# Patient Record
Sex: Female | Born: 1945 | Race: Black or African American | Hispanic: No | Smoking: Never smoker
Health system: Southern US, Community
[De-identification: ages and names within clinical notes are randomized; demographics above are authoritative.]

## PROBLEM LIST (undated history)

## (undated) DIAGNOSIS — M1990 Unspecified osteoarthritis, unspecified site: Secondary | ICD-10-CM

## (undated) DIAGNOSIS — I82A29 Chronic embolism and thrombosis of unspecified axillary vein: Secondary | ICD-10-CM

## (undated) DIAGNOSIS — H269 Unspecified cataract: Secondary | ICD-10-CM

## (undated) DIAGNOSIS — E669 Obesity, unspecified: Secondary | ICD-10-CM

## (undated) DIAGNOSIS — K5790 Diverticulosis of intestine, part unspecified, without perforation or abscess without bleeding: Secondary | ICD-10-CM

## (undated) DIAGNOSIS — Z86718 Personal history of other venous thrombosis and embolism: Secondary | ICD-10-CM

## (undated) DIAGNOSIS — R7303 Prediabetes: Secondary | ICD-10-CM

## (undated) DIAGNOSIS — J189 Pneumonia, unspecified organism: Secondary | ICD-10-CM

## (undated) HISTORY — DX: Personal history of other venous thrombosis and embolism: Z86.718

## (undated) HISTORY — DX: Prediabetes: R73.03

## (undated) HISTORY — DX: Unspecified osteoarthritis, unspecified site: M19.90

## (undated) HISTORY — DX: Obesity, unspecified: E66.9

## (undated) HISTORY — PX: OOPHORECTOMY: SHX86

## (undated) HISTORY — PX: SUPRANUMERARY NIPPLE EXCISION: SHX2471

## (undated) HISTORY — PX: BREAST EXCISIONAL BIOPSY: SUR124

## (undated) HISTORY — PX: OTHER SURGICAL HISTORY: SHX169

## (undated) HISTORY — DX: Diverticulosis of intestine, part unspecified, without perforation or abscess without bleeding: K57.90

## (undated) HISTORY — PX: CATARACT EXTRACTION: SUR2

---

## 1999-11-08 DIAGNOSIS — I82A29 Chronic embolism and thrombosis of unspecified axillary vein: Secondary | ICD-10-CM

## 1999-11-08 HISTORY — DX: Chronic embolism and thrombosis of unspecified axillary vein: I82.A29

## 2004-08-20 ENCOUNTER — Encounter: Payer: Self-pay | Admitting: Internal Medicine

## 2004-09-01 ENCOUNTER — Encounter: Admission: RE | Admit: 2004-09-01 | Discharge: 2004-09-01 | Payer: Self-pay | Admitting: Internal Medicine

## 2004-11-07 HISTORY — PX: ABDOMINAL HYSTERECTOMY: SHX81

## 2005-07-18 ENCOUNTER — Ambulatory Visit: Payer: Self-pay | Admitting: Internal Medicine

## 2005-07-19 ENCOUNTER — Encounter: Admission: RE | Admit: 2005-07-19 | Discharge: 2005-07-19 | Payer: Self-pay | Admitting: General Surgery

## 2005-11-17 ENCOUNTER — Emergency Department (HOSPITAL_COMMUNITY): Admission: EM | Admit: 2005-11-17 | Discharge: 2005-11-18 | Payer: Self-pay | Admitting: Emergency Medicine

## 2005-11-17 ENCOUNTER — Ambulatory Visit: Payer: Self-pay | Admitting: Internal Medicine

## 2005-11-17 ENCOUNTER — Ambulatory Visit: Payer: Self-pay | Admitting: Cardiology

## 2005-11-23 ENCOUNTER — Ambulatory Visit: Payer: Self-pay | Admitting: Internal Medicine

## 2005-12-22 ENCOUNTER — Other Ambulatory Visit: Admission: RE | Admit: 2005-12-22 | Discharge: 2005-12-22 | Payer: Self-pay | Admitting: Obstetrics and Gynecology

## 2006-01-17 ENCOUNTER — Ambulatory Visit: Payer: Self-pay | Admitting: Internal Medicine

## 2006-01-23 ENCOUNTER — Ambulatory Visit: Payer: Self-pay | Admitting: Internal Medicine

## 2006-02-03 ENCOUNTER — Encounter: Payer: Self-pay | Admitting: Internal Medicine

## 2006-02-03 ENCOUNTER — Ambulatory Visit: Payer: Self-pay

## 2006-02-14 ENCOUNTER — Encounter (INDEPENDENT_AMBULATORY_CARE_PROVIDER_SITE_OTHER): Payer: Self-pay | Admitting: *Deleted

## 2006-02-14 ENCOUNTER — Inpatient Hospital Stay (HOSPITAL_COMMUNITY): Admission: RE | Admit: 2006-02-14 | Discharge: 2006-02-17 | Payer: Self-pay | Admitting: Obstetrics and Gynecology

## 2006-02-22 ENCOUNTER — Inpatient Hospital Stay (HOSPITAL_COMMUNITY): Admission: AD | Admit: 2006-02-22 | Discharge: 2006-02-24 | Payer: Self-pay | Admitting: Obstetrics and Gynecology

## 2006-08-08 ENCOUNTER — Encounter: Admission: RE | Admit: 2006-08-08 | Discharge: 2006-08-08 | Payer: Self-pay | Admitting: Internal Medicine

## 2006-10-02 ENCOUNTER — Emergency Department (HOSPITAL_COMMUNITY): Admission: EM | Admit: 2006-10-02 | Discharge: 2006-10-03 | Payer: Self-pay | Admitting: Emergency Medicine

## 2006-10-07 ENCOUNTER — Ambulatory Visit: Payer: Self-pay | Admitting: Family Medicine

## 2006-11-07 HISTORY — PX: OTHER SURGICAL HISTORY: SHX169

## 2007-05-28 DIAGNOSIS — J45909 Unspecified asthma, uncomplicated: Secondary | ICD-10-CM | POA: Insufficient documentation

## 2007-06-18 ENCOUNTER — Ambulatory Visit: Payer: Self-pay | Admitting: Internal Medicine

## 2007-06-25 ENCOUNTER — Ambulatory Visit: Payer: Self-pay | Admitting: Internal Medicine

## 2007-06-25 LAB — CONVERTED CEMR LAB
ALT: 23 units/L (ref 0–35)
AST: 21 units/L (ref 0–37)
Albumin: 4.1 g/dL (ref 3.5–5.2)
Alkaline Phosphatase: 77 units/L (ref 39–117)
BUN: 8 mg/dL (ref 6–23)
Basophils Absolute: 0 10*3/uL (ref 0.0–0.1)
Basophils Relative: 0.4 % (ref 0.0–1.0)
Bilirubin Urine: NEGATIVE
Bilirubin, Direct: 0.1 mg/dL (ref 0.0–0.3)
CO2: 32 meq/L (ref 19–32)
Calcium: 10.5 mg/dL (ref 8.4–10.5)
Chloride: 113 meq/L — ABNORMAL HIGH (ref 96–112)
Cholesterol: 186 mg/dL (ref 0–200)
Creatinine, Ser: 0.7 mg/dL (ref 0.4–1.2)
Creatinine,U: 142.2 mg/dL
Eosinophils Absolute: 0.2 10*3/uL (ref 0.0–0.6)
Eosinophils Relative: 3.9 % (ref 0.0–5.0)
GFR calc Af Amer: 111 mL/min
GFR calc non Af Amer: 91 mL/min
Glucose, Bld: 114 mg/dL — ABNORMAL HIGH (ref 70–99)
Glucose, Urine, Semiquant: NEGATIVE
HCT: 37 % (ref 36.0–46.0)
HDL: 44 mg/dL (ref >39.0)
Hemoglobin: 12.4 g/dL (ref 12.0–15.0)
Hgb A1c MFr Bld: 5.4 % (ref 4.6–6.0)
Ketones, urine, test strip: NEGATIVE
LDL Cholesterol: 129 mg/dL — ABNORMAL HIGH (ref 0–99)
Lymphocytes Relative: 28.6 % (ref 12.0–46.0)
MCHC: 33.6 g/dL (ref 30.0–36.0)
MCV: 86 fL (ref 78.0–100.0)
Microalb Creat Ratio: 14.1 mg/g (ref 0.0–30.0)
Microalb, Ur: 2 mg/dL — ABNORMAL HIGH (ref 0.0–1.9)
Monocytes Absolute: 0.4 10*3/uL (ref 0.2–0.7)
Monocytes Relative: 6.5 % (ref 3.0–11.0)
Neutro Abs: 3.8 10*3/uL (ref 1.4–7.7)
Neutrophils Relative %: 60.6 % (ref 43.0–77.0)
Nitrite: NEGATIVE
Platelets: 268 10*3/uL (ref 150–400)
Potassium: 4.9 meq/L (ref 3.5–5.1)
Protein, U semiquant: NEGATIVE
RBC: 4.3 M/uL (ref 3.87–5.11)
RDW: 14.6 % (ref 11.5–14.6)
Sodium: 149 meq/L — ABNORMAL HIGH (ref 135–145)
Specific Gravity, Urine: 1.015
TSH: 1.56 microintl units/mL (ref 0.35–5.50)
Total Bilirubin: 0.6 mg/dL (ref 0.3–1.2)
Total CHOL/HDL Ratio: 4.2
Total Protein: 8.3 g/dL (ref 6.0–8.3)
Triglycerides: 67 mg/dL (ref 0–149)
Urobilinogen, UA: 0.2
VLDL: 13 mg/dL (ref 0–40)
WBC Urine, dipstick: NEGATIVE
WBC: 6.2 10*3/uL (ref 4.5–10.5)
pH: 5

## 2007-07-30 ENCOUNTER — Ambulatory Visit: Payer: Self-pay | Admitting: Internal Medicine

## 2007-09-10 ENCOUNTER — Ambulatory Visit: Payer: Self-pay | Admitting: Gastroenterology

## 2007-09-13 ENCOUNTER — Ambulatory Visit: Payer: Self-pay | Admitting: Internal Medicine

## 2007-09-24 ENCOUNTER — Ambulatory Visit: Payer: Self-pay | Admitting: Gastroenterology

## 2007-09-24 ENCOUNTER — Encounter: Payer: Self-pay | Admitting: Internal Medicine

## 2007-10-08 ENCOUNTER — Ambulatory Visit: Payer: Self-pay | Admitting: Internal Medicine

## 2007-10-08 DIAGNOSIS — L919 Hypertrophic disorder of the skin, unspecified: Secondary | ICD-10-CM

## 2007-10-08 DIAGNOSIS — L909 Atrophic disorder of skin, unspecified: Secondary | ICD-10-CM | POA: Insufficient documentation

## 2007-10-11 ENCOUNTER — Ambulatory Visit (HOSPITAL_COMMUNITY): Admission: RE | Admit: 2007-10-11 | Discharge: 2007-10-11 | Payer: Self-pay | Admitting: Internal Medicine

## 2007-10-11 ENCOUNTER — Ambulatory Visit: Payer: Self-pay | Admitting: Internal Medicine

## 2007-10-22 ENCOUNTER — Ambulatory Visit: Payer: Self-pay | Admitting: Internal Medicine

## 2008-01-09 ENCOUNTER — Telehealth: Payer: Self-pay | Admitting: Internal Medicine

## 2008-09-27 IMAGING — CT CT ABDOMEN W/O CM
2 of 4 series · 17 of 46 positions shown, 19 images · IV contrast (agent unspecified)
Comparison: 02/23/06.

CLINICAL DATA: Abdominal and pelvic pain.  Flank pain.
ABDOMEN CT WITHOUT CONTRAST:
TECHNIQUE: Multidetector CT imaging of the abdomen was performed following the standard protocol without IV contrast.
TECHNIQUE: Multidetector CT imaging of the pelvis was performed following the standard protocol without IV contrast.

[Series 2: stone_wo 5.0 b40s st · axial · 0.79mm/px · z∈[-443,-131]mm · 14 of 89 slices shown, 16 images]
[im 7/89  soft-tissue]
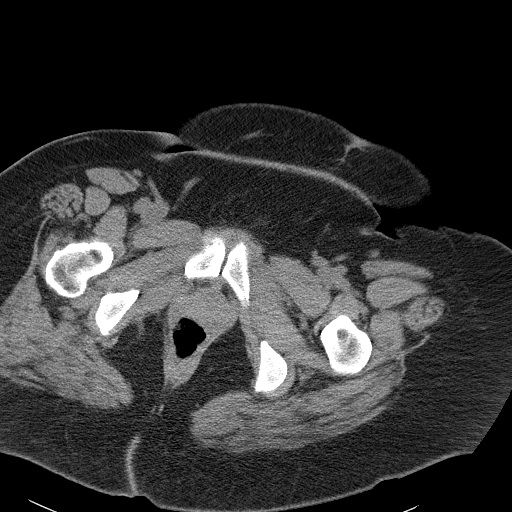
[im 7/89  bone]
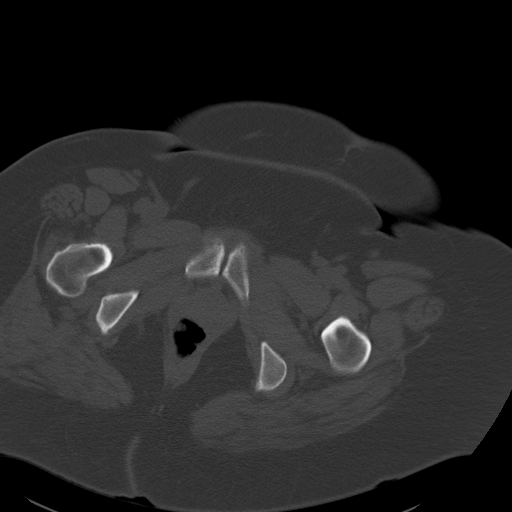
[im 13/89  soft-tissue]
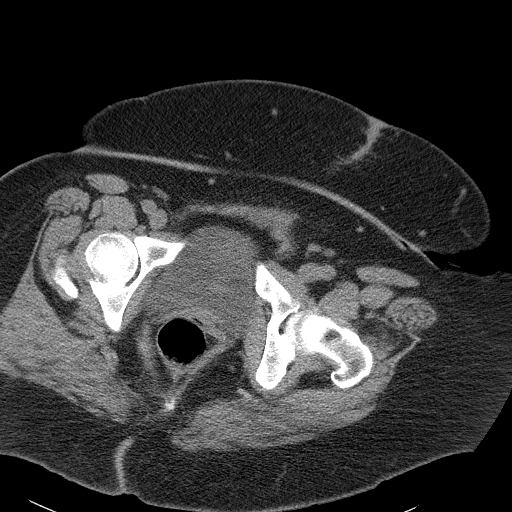
[im 19/89  soft-tissue]
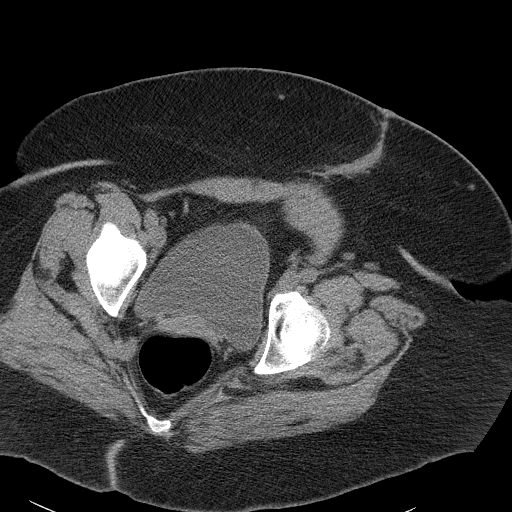
[im 25/89  soft-tissue]
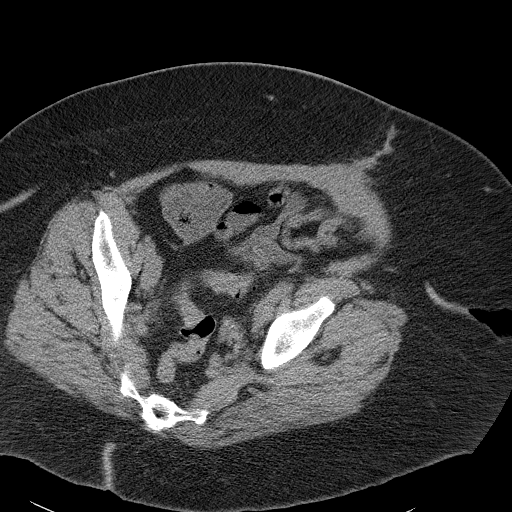
[im 31/89  soft-tissue]
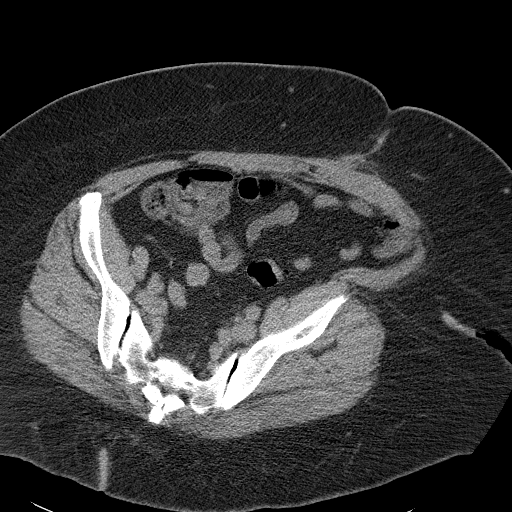
[im 37/89  soft-tissue]
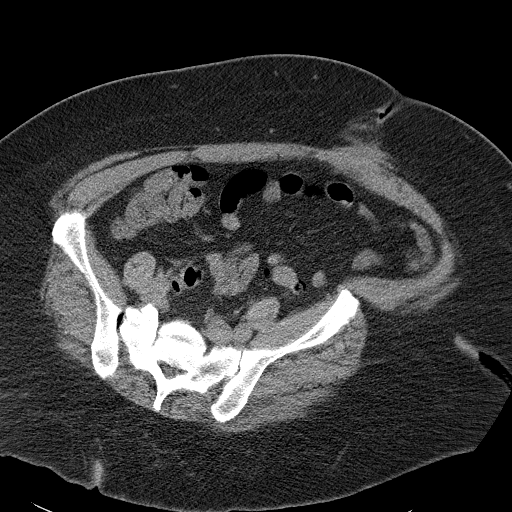
[im 43/89  soft-tissue]
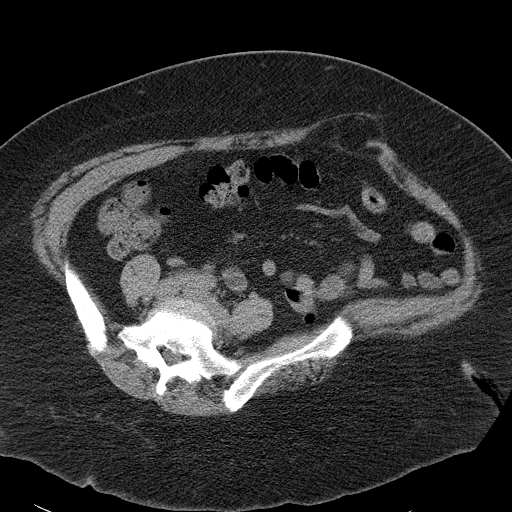
[im 49/89  soft-tissue]
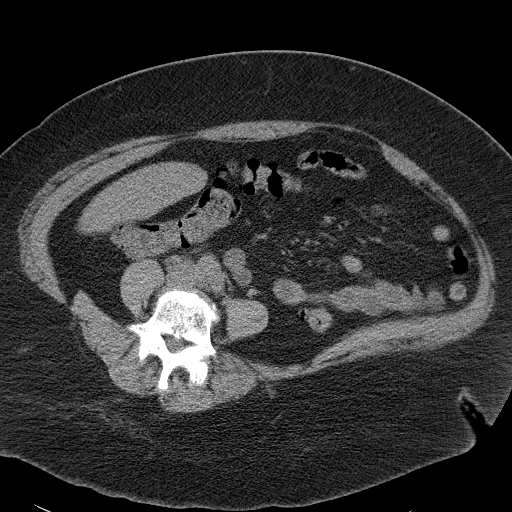
[im 55/89  soft-tissue]
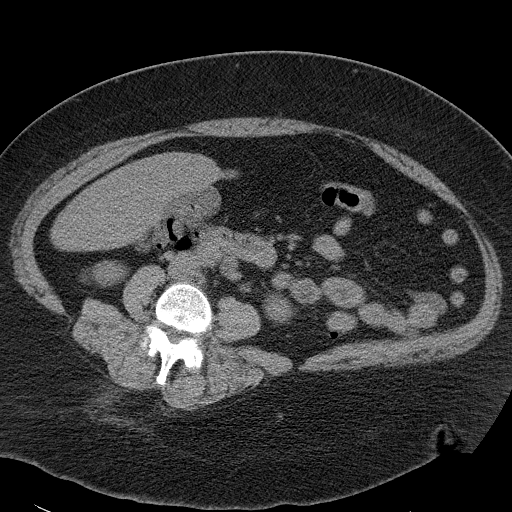
[im 55/89  bone]
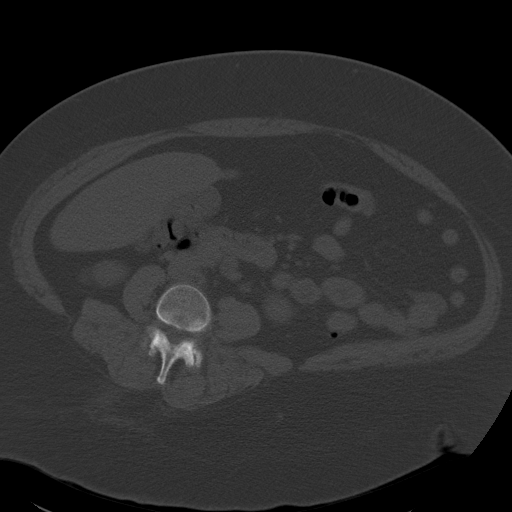
[im 61/89  soft-tissue]
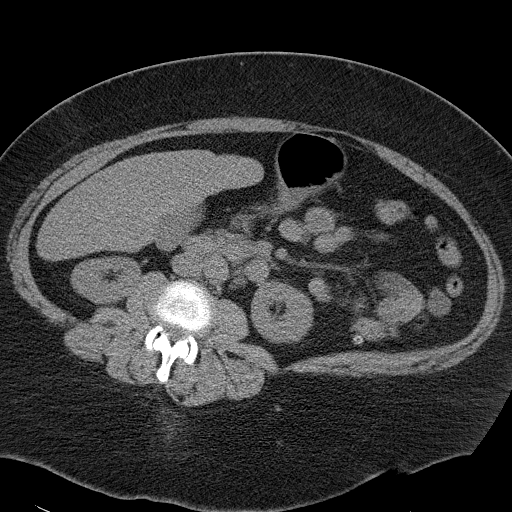
[im 67/89  soft-tissue]
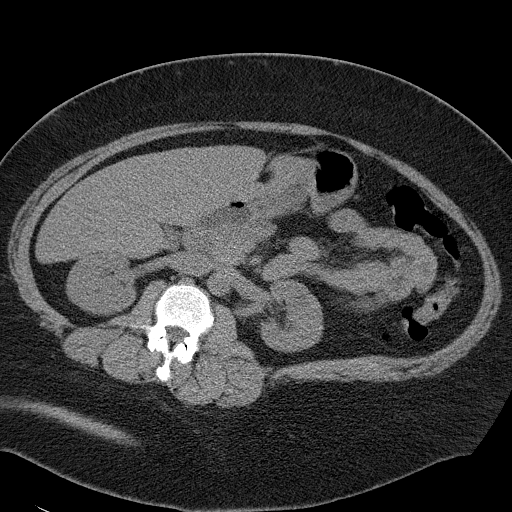
[im 73/89  soft-tissue]
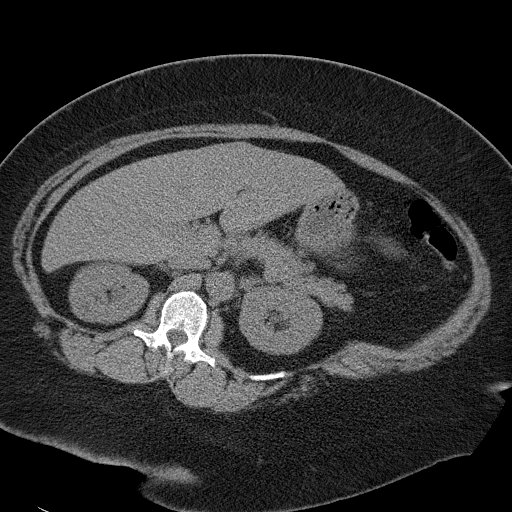
[im 79/89  soft-tissue]
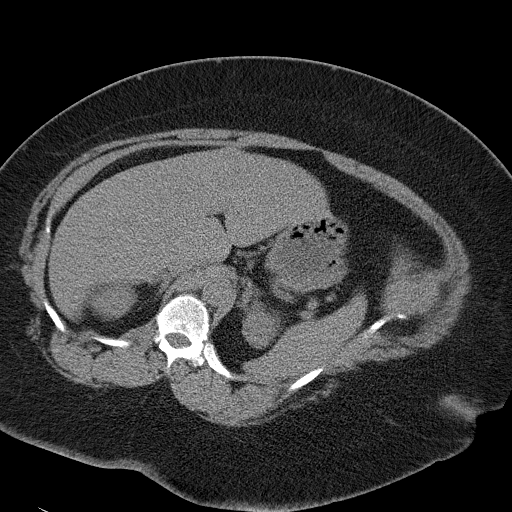
[im 85/89  soft-tissue]
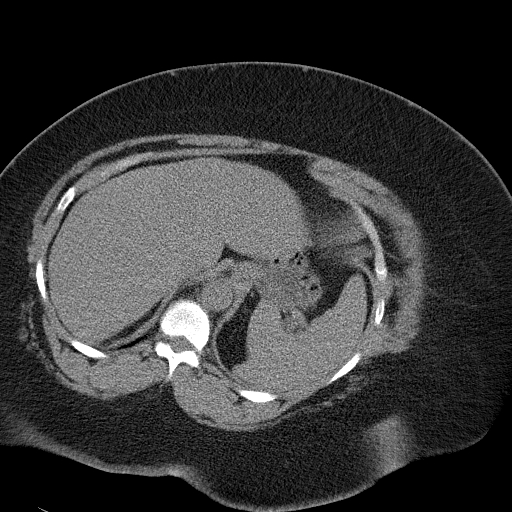

[Series 602: <mpr range> · coronal · 0.79mm/px · 3 of 84 slices shown]
[im 28/84  soft-tissue]
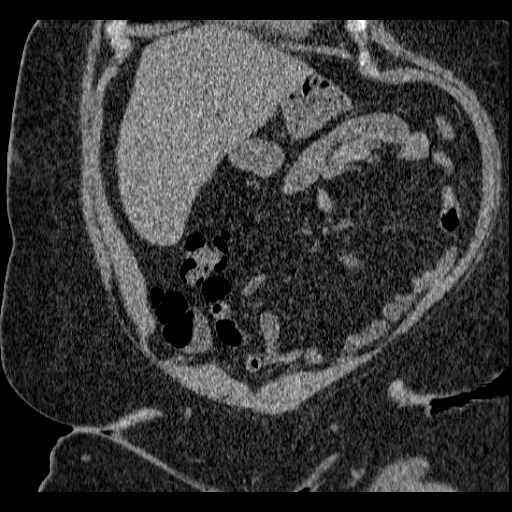
[im 37/84  soft-tissue]
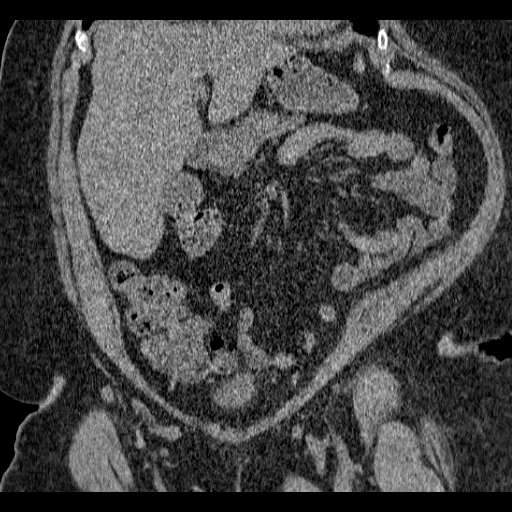
[im 47/84  soft-tissue]
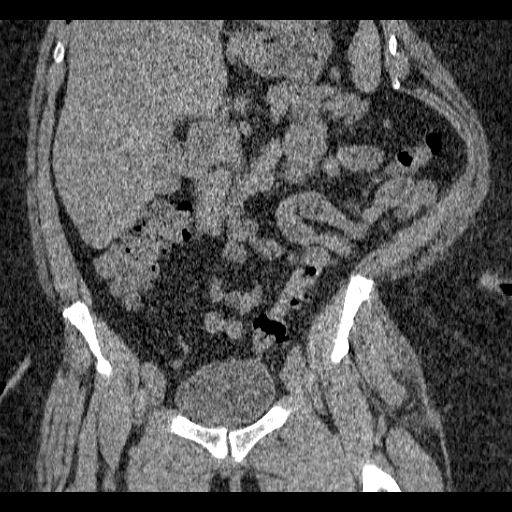

[17 of 46 positions shown; findings below may reference images not displayed]

FINDINGS: The visualized portions of the liver and spleen are within normal limits.  The pancreas, adrenal glands, kidneys, and gallbladder are within normal limits.  The renal lesions seen on the prior study are not well-visualized.  Please note that parenchymal abnormalities may be missed as intravenous contrast was not administered.  No evidence of free fluid, enlarged lymph nodes, abdominal aortic aneurysm, or biliary dilatation.  No urinary calculi are identified.  
An umbilical hernia containing fat is identified.  Colonic diverticulosis is present without evidence of diverticulitis.
IMPRESSION: 1.  No acute abnormality.
2.  Colonic diverticulosis without evidence of diverticulitis.
3.  Stable umbilical hernia containing fat.  
PELVIS CT WITHOUT CONTRAST:
FINDINGS: Colonic diverticulosis is present without evidence of diverticulitis.  There is no evidence of urinary calculi present.  No free fluid or enlarged lymph nodes are present.  The patient is status post hysterectomy.  Degenerative changes in the lumbar spine and SI joints noted.
IMPRESSION: No acute abnormality.

## 2008-10-16 ENCOUNTER — Ambulatory Visit (HOSPITAL_COMMUNITY): Admission: RE | Admit: 2008-10-16 | Discharge: 2008-10-16 | Payer: Self-pay | Admitting: Obstetrics and Gynecology

## 2008-11-05 ENCOUNTER — Encounter: Admission: RE | Admit: 2008-11-05 | Discharge: 2008-11-05 | Payer: Self-pay | Admitting: Obstetrics and Gynecology

## 2008-11-07 LAB — CONVERTED CEMR LAB: Pap Smear: NORMAL

## 2008-11-28 ENCOUNTER — Telehealth: Payer: Self-pay | Admitting: Internal Medicine

## 2008-11-28 ENCOUNTER — Encounter: Payer: Self-pay | Admitting: Internal Medicine

## 2008-12-01 ENCOUNTER — Ambulatory Visit: Payer: Self-pay | Admitting: Internal Medicine

## 2008-12-01 LAB — CONVERTED CEMR LAB
Albumin: 3.9 g/dL (ref 3.5–5.2)
Alkaline Phosphatase: 75 units/L (ref 39–117)
Basophils Absolute: 0 10*3/uL (ref 0.0–0.1)
Basophils Relative: 0.6 % (ref 0.0–3.0)
Bilirubin Urine: NEGATIVE
Bilirubin, Direct: 0.1 mg/dL (ref 0.0–0.3)
Blood in Urine, dipstick: NEGATIVE
Cholesterol: 197 mg/dL (ref 0–200)
Creatinine,U: 113.7 mg/dL
Eosinophils Relative: 3 % (ref 0.0–5.0)
GFR calc non Af Amer: 91 mL/min
HDL: 40 mg/dL (ref >39.0)
LDL Cholesterol: 136 mg/dL — ABNORMAL HIGH (ref 0–99)
Lymphocytes Relative: 38.1 % (ref 12.0–46.0)
Monocytes Absolute: 0.3 10*3/uL (ref 0.1–1.0)
Monocytes Relative: 6.1 % (ref 3.0–12.0)
Neutrophils Relative %: 52.2 % (ref 43.0–77.0)
Platelets: 228 10*3/uL (ref 150–400)
Sodium: 144 meq/L (ref 135–145)
TSH: 2.03 microintl units/mL (ref 0.35–5.50)
Total Bilirubin: 0.7 mg/dL (ref 0.3–1.2)
Total CHOL/HDL Ratio: 4.9
Total Protein: 8.4 g/dL — ABNORMAL HIGH (ref 6.0–8.3)
Triglycerides: 106 mg/dL (ref 0–149)
Urobilinogen, UA: 0.2
VLDL: 21 mg/dL (ref 0–40)

## 2008-12-09 ENCOUNTER — Ambulatory Visit: Payer: Self-pay | Admitting: Internal Medicine

## 2008-12-09 DIAGNOSIS — E669 Obesity, unspecified: Secondary | ICD-10-CM

## 2008-12-09 LAB — HM COLONOSCOPY

## 2008-12-19 ENCOUNTER — Encounter: Payer: Self-pay | Admitting: Internal Medicine

## 2008-12-24 ENCOUNTER — Ambulatory Visit (HOSPITAL_BASED_OUTPATIENT_CLINIC_OR_DEPARTMENT_OTHER): Admission: RE | Admit: 2008-12-24 | Discharge: 2008-12-24 | Payer: Self-pay | Admitting: Surgery

## 2008-12-24 ENCOUNTER — Encounter: Admission: RE | Admit: 2008-12-24 | Discharge: 2008-12-24 | Payer: Self-pay | Admitting: Surgery

## 2008-12-24 ENCOUNTER — Encounter (INDEPENDENT_AMBULATORY_CARE_PROVIDER_SITE_OTHER): Payer: Self-pay | Admitting: Surgery

## 2009-01-08 ENCOUNTER — Encounter: Payer: Self-pay | Admitting: Internal Medicine

## 2009-07-10 ENCOUNTER — Ambulatory Visit: Payer: Self-pay | Admitting: Internal Medicine

## 2009-07-10 DIAGNOSIS — L708 Other acne: Secondary | ICD-10-CM | POA: Insufficient documentation

## 2009-08-10 ENCOUNTER — Ambulatory Visit: Payer: Self-pay | Admitting: Internal Medicine

## 2010-01-04 ENCOUNTER — Ambulatory Visit: Payer: Self-pay | Admitting: Internal Medicine

## 2010-01-04 LAB — CONVERTED CEMR LAB
Bilirubin Urine: NEGATIVE
Blood in Urine, dipstick: NEGATIVE
Ketones, urine, test strip: NEGATIVE
Nitrite: NEGATIVE
Urobilinogen, UA: 0.2
WBC Urine, dipstick: NEGATIVE
pH: 5

## 2010-01-05 ENCOUNTER — Encounter: Payer: Self-pay | Admitting: Internal Medicine

## 2010-01-05 LAB — CONVERTED CEMR LAB
AST: 21 units/L (ref 0–37)
Albumin: 4.1 g/dL (ref 3.5–5.2)
Alkaline Phosphatase: 81 units/L (ref 39–117)
Chloride: 109 meq/L (ref 96–112)
Cholesterol: 210 mg/dL — ABNORMAL HIGH (ref 0–200)
Creatinine, Ser: 0.7 mg/dL (ref 0.4–1.2)
Direct LDL: 150.7 mg/dL
HDL: 50.7 mg/dL (ref >39.00)
Hemoglobin: 12.1 g/dL (ref 12.0–15.0)
Lymphocytes Relative: 37.8 % (ref 12.0–46.0)
Lymphs Abs: 2.1 10*3/uL (ref 0.7–4.0)
MCHC: 32.3 g/dL (ref 30.0–36.0)
MCV: 90.7 fL (ref 78.0–100.0)
Monocytes Relative: 5.8 % (ref 3.0–12.0)
Neutro Abs: 3 10*3/uL (ref 1.4–7.7)
Neutrophils Relative %: 52.4 % (ref 43.0–77.0)
Platelets: 232 10*3/uL (ref 150.0–400.0)
RBC: 4.13 M/uL (ref 3.87–5.11)
RDW: 14.1 % (ref 11.5–14.6)
TSH: 2.79 microintl units/mL (ref 0.35–5.50)
Total CHOL/HDL Ratio: 4
Total Protein: 8.5 g/dL — ABNORMAL HIGH (ref 6.0–8.3)
VLDL: 21.8 mg/dL (ref 0.0–40.0)

## 2010-01-11 ENCOUNTER — Ambulatory Visit: Payer: Self-pay | Admitting: Internal Medicine

## 2010-01-13 ENCOUNTER — Encounter: Payer: Self-pay | Admitting: Internal Medicine

## 2010-01-14 ENCOUNTER — Ambulatory Visit (HOSPITAL_COMMUNITY): Admission: RE | Admit: 2010-01-14 | Discharge: 2010-01-14 | Payer: Self-pay | Admitting: Internal Medicine

## 2010-01-14 LAB — HM MAMMOGRAPHY: HM Mammogram: NORMAL

## 2010-06-23 ENCOUNTER — Telehealth: Payer: Self-pay | Admitting: Internal Medicine

## 2010-11-26 ENCOUNTER — Other Ambulatory Visit: Payer: Self-pay | Admitting: Internal Medicine

## 2010-11-26 DIAGNOSIS — Z1231 Encounter for screening mammogram for malignant neoplasm of breast: Secondary | ICD-10-CM

## 2010-11-26 DIAGNOSIS — Z0000 Encounter for general adult medical examination without abnormal findings: Secondary | ICD-10-CM

## 2010-11-28 ENCOUNTER — Encounter: Payer: Self-pay | Admitting: Obstetrics and Gynecology

## 2010-11-28 ENCOUNTER — Encounter: Payer: Self-pay | Admitting: Internal Medicine

## 2010-12-07 NOTE — Medication Information (Signed)
Summary: Coverage Approval for Differin  Coverage Approval for Differin   Imported By: Maryln Gottron 01/15/2010 12:34:29  _____________________________________________________________________  External Attachment:    Type:   Image     Comment:   External Document

## 2010-12-07 NOTE — Progress Notes (Signed)
Summary: call  Phone Note Call from Patient Call back at Home Phone [phone]   Caller: vm Summary of Call: Right shoulder pain.  Could I use OTC Ibuprofen? Initial call taken by: Rudy Jew, RN,  June 23, 2010 1:48 PM  Follow-up for Phone Call        Doesn't have history of stomach upset or GI bleeding.  Will try them & perhaps also one of the medicinal rubs OTC, cautioned her to watch her skin & not burn it.   OV as needed.  Follow-up by: Rudy Jew, RN,  June 23, 2010 2:51 PM

## 2010-12-07 NOTE — Assessment & Plan Note (Signed)
Summary: CPX // RS   Vital Signs:  Patient profile:   65 year old female Menstrual status:  hysterectomy Height:      60 inches Weight:      319 pounds BMI:     62.53 Pulse rate:   80 / minute Pulse rhythm:   regular Resp:     12 per minute BP sitting:   118 / 76  (left arm) Cuff size:   large  Vitals Entered By: Gladis Riffle, RN (January 11, 2010 10:07 AM)  Nutrition Counseling: Patient's BMI is greater than 25 and therefore counseled on weight management options. CC: cpx, labs done Is Patient Diabetic? No Comments has not been taking doxycycline     Menstrual Status hysterectomy Last PAP Result normal-pt's report   CC:  cpx and labs done.  History of Present Illness: cpx  several additional complaints: elevated serum protein---she is concerned  cystic acne---better with doxycycline but having a current flare---right side of face  hx of arthritic pain---previousl better with NSAID  Hx of wheezing---previously better on Advair---can;t afford any longer.  Obesity: she is not dieting or following an exercise plan  All other systems reviewed and were negative   Preventive Screening-Counseling & Management  Alcohol-Tobacco     Smoking Status: never  Current Problems (verified): 1)  Cystic Acne  (ICD-706.1) 2)  Obesity  (ICD-278.00) 3)  Skin Tag  (ICD-701.9) 4)  Preventive Health Care  (ICD-V70.0) 5)  Asthma  (ICD-493.90)  Current Medications (verified): 1)  Proair Hfa 108 (90 Base) Mcg/act  Aers (Albuterol Sulfate) .... Two Puffs Four Times Daily As Needed 2)  Aleve 220 Mg  Tabs (Naproxen Sodium) .... 2 Once Daily 3)  Multivitamins  Tabs (Multiple Vitamin) .... Once Daily 4)  Doxycycline Hyclate 100 Mg Tabs (Doxycycline Hyclate) .... Take 1 Tablet By Mouth Once A Day  Allergies: 1)  ! Penicillin V Potassium (Penicillin V Potassium)  Past History:  Past Medical History: Last updated: 07-31-07 Asthma obesity  Past Surgical History: Last updated:  12/09/2008  Hysterectomy Oophorectomy removed nipple from R breast.   Family History: Last updated: 07-31-07 mother deceased CHF-age 70yo father deceased COPD-age 75  Social History: Last updated: 07-31-07 no kids Retired Divorced Never Smoked Regular exercise-no  Risk Factors: Exercise: no (2007-07-31)  Risk Factors: Smoking Status: never (01/11/2010)  Review of Systems       All other systems reviewed and were negative   Physical Exam  General:  Well-developed,well-nourished,in no acute distress; alert,appropriate and cooperative throughout examination  morbidly obese morbid obesity Head:  normocephalic and atraumatic.   Eyes:  pupils equal and pupils round.   Ears:  R ear normal and L ear normal.   Nose:  no external deformity and no external erythema.   Neck:  No deformities, masses, or tenderness noted. Chest Wall:  No deformities, masses, or tenderness noted. Lungs:  Normal respiratory effort, chest expands symmetrically. Lungs are clear to auscultation, no crackles or wheezes. Heart:  normal rate and regular rhythm.   Abdomen:  morbidly obesesoft.   Msk:  No deformity or scoliosis noted of thoracic or lumbar spine.    Pulses:  R radial normal and L radial normal.   Neurologic:  cranial nerves II-XII intact and walks with a cane  Skin:  turgor normal and color normal.   cystic acne present---associated scarring Cervical Nodes:  no anterior cervical adenopathy and no posterior cervical adenopathy.   Psych:  normally interactive and good eye contact.  Impression & Recommendations:  Problem # 1:  PREVENTIVE HEALTH CARE (ICD-V70.0) major issue is morbid obesity discussed weight loss---needs to exercise every day and follow a low fat diet, low calorie diet  Problem # 2:  CYSTIC ACNE (ICD-706.1) likely related to obesity  Her updated medication list for this problem includes:    Doxycycline Hyclate 100 Mg Tabs (Doxycycline hyclate) .Marland Kitchen... Take 1  tablet by mouth once a day    Adapalene 0.1 % Gel (Adapalene) .Marland Kitchen... Apply to face at bedtime  Problem # 3:  OBESITY (ICD-278.00) clearly this is most significant problem advised daily exercise and low calorie diet  Complete Medication List: 1)  Proair Hfa 108 (90 Base) Mcg/act Aers (Albuterol sulfate) .... Two puffs four times daily as needed 2)  Aleve 220 Mg Tabs (Naproxen sodium) .... 2 once daily 3)  Multivitamins Tabs (Multiple vitamin) .... Once daily 4)  Doxycycline Hyclate 100 Mg Tabs (Doxycycline hyclate) .... Take 1 tablet by mouth once a day 5)  Adapalene 0.1 % Gel (Adapalene) .... Apply to face at bedtime  Patient Instructions: 1)  Please schedule a follow-up appointment in 6 months. Prescriptions: DOXYCYCLINE HYCLATE 100 MG TABS (DOXYCYCLINE HYCLATE) Take 1 tablet by mouth once a day  #30 x 6   Entered and Authorized by:   Birdie Sons MD   Signed by:   Birdie Sons MD on 01/11/2010   Method used:   Electronically to        [facility] [phone]* (retail)       [address]       [place], [place]  [address]       Ph: [phone]       Fax: [phone]   RxID:   [id number] ADAPALENE 0.1 % GEL (ADAPALENE) apply to face at bedtime  #1 tube x 1   Entered and Authorized by:   Birdie Sons MD   Signed by:   Birdie Sons MD on 01/11/2010   Method used:   Electronically to        [facility] [phone]* (retail)       [address]       [place], [place]  [address]       Ph: [phone]       Fax: [phone]   RxID:   [id number]

## 2011-01-06 ENCOUNTER — Ambulatory Visit (HOSPITAL_COMMUNITY): Payer: Self-pay

## 2011-01-17 ENCOUNTER — Ambulatory Visit (HOSPITAL_COMMUNITY): Admission: RE | Admit: 2011-01-17 | Payer: Federal, State, Local not specified - PPO | Source: Ambulatory Visit

## 2011-01-17 ENCOUNTER — Ambulatory Visit (HOSPITAL_COMMUNITY): Payer: Self-pay

## 2011-01-26 ENCOUNTER — Ambulatory Visit (HOSPITAL_COMMUNITY)
Admission: RE | Admit: 2011-01-26 | Discharge: 2011-01-26 | Disposition: A | Discharge status: Home / Self Care | Payer: Federal, State, Local not specified - PPO | Source: Ambulatory Visit | Attending: Internal Medicine | Admitting: Internal Medicine

## 2011-01-26 DIAGNOSIS — Z1231 Encounter for screening mammogram for malignant neoplasm of breast: Secondary | ICD-10-CM | POA: Insufficient documentation

## 2011-01-26 LAB — HM MAMMOGRAPHY: HM Mammogram: NORMAL

## 2011-02-03 ENCOUNTER — Other Ambulatory Visit: Payer: Self-pay | Admitting: Obstetrics and Gynecology

## 2011-02-03 ENCOUNTER — Other Ambulatory Visit: Payer: Self-pay | Admitting: Internal Medicine

## 2011-02-09 ENCOUNTER — Ambulatory Visit
Admission: RE | Admit: 2011-02-09 | Discharge: 2011-02-09 | Disposition: A | Discharge status: Home / Self Care | Payer: Federal, State, Local not specified - PPO | Source: Ambulatory Visit | Attending: Obstetrics and Gynecology | Admitting: Obstetrics and Gynecology

## 2011-02-14 ENCOUNTER — Encounter: Payer: Self-pay | Admitting: Family

## 2011-02-15 ENCOUNTER — Ambulatory Visit (HOSPITAL_BASED_OUTPATIENT_CLINIC_OR_DEPARTMENT_OTHER)
Admission: RE | Admit: 2011-02-15 | Discharge: 2011-02-15 | Disposition: A | Discharge status: Home / Self Care | Payer: Federal, State, Local not specified - PPO | Source: Ambulatory Visit | Attending: Family | Admitting: Family

## 2011-02-15 ENCOUNTER — Encounter: Payer: Self-pay | Admitting: Family

## 2011-02-15 ENCOUNTER — Ambulatory Visit (INDEPENDENT_AMBULATORY_CARE_PROVIDER_SITE_OTHER): Payer: Federal, State, Local not specified - PPO | Admitting: Family

## 2011-02-15 VITALS — BP 120/70 | HR 78 | Temp 98.0°F | Resp 20 | Ht 60.0 in | Wt 299.0 lb

## 2011-02-15 DIAGNOSIS — M47817 Spondylosis without myelopathy or radiculopathy, lumbosacral region: Secondary | ICD-10-CM | POA: Insufficient documentation

## 2011-02-15 DIAGNOSIS — M549 Dorsalgia, unspecified: Secondary | ICD-10-CM

## 2011-02-15 DIAGNOSIS — L708 Other acne: Secondary | ICD-10-CM

## 2011-02-15 DIAGNOSIS — M545 Low back pain, unspecified: Secondary | ICD-10-CM | POA: Insufficient documentation

## 2011-02-15 DIAGNOSIS — M79609 Pain in unspecified limb: Secondary | ICD-10-CM

## 2011-02-15 DIAGNOSIS — M169 Osteoarthritis of hip, unspecified: Secondary | ICD-10-CM | POA: Insufficient documentation

## 2011-02-15 DIAGNOSIS — M1990 Unspecified osteoarthritis, unspecified site: Secondary | ICD-10-CM

## 2011-02-15 DIAGNOSIS — M1610 Unilateral primary osteoarthritis, unspecified hip: Secondary | ICD-10-CM | POA: Insufficient documentation

## 2011-02-15 DIAGNOSIS — M79601 Pain in right arm: Secondary | ICD-10-CM

## 2011-02-15 DIAGNOSIS — E669 Obesity, unspecified: Secondary | ICD-10-CM

## 2011-02-15 MED ORDER — CLINDAMYCIN PHOS-BENZOYL PEROX 1-5 % EX GEL: CUTANEOUS | Status: AC

## 2011-02-15 MED ORDER — NAPROXEN SODIUM 220 MG PO TABS: 220.0000 mg | ORAL_TABLET | Freq: Two times a day (BID) | ORAL | Status: DC | PRN

## 2011-02-15 NOTE — Assessment & Plan Note (Signed)
Will add a topical agent and continue doxycycline.

## 2011-02-15 NOTE — Assessment & Plan Note (Signed)
Check x-ray of LS spine.  I advised patient that continued weight loss is very important.

## 2011-02-15 NOTE — Assessment & Plan Note (Signed)
Will check Cspine film.  Plan to treat with NSAIDS for now.

## 2011-02-15 NOTE — Progress Notes (Signed)
Subjective:    Patient ID: Carla Nash, female    DOB: May 05, 1946, 65 y.o.   MRN: [id number]  HPI  Ms.  Brutus is a 65 yr old female who presents to establish care.  She has chief complaint of R sided arm and leg pain. This pain started several months ago.  She has difficulty sleeping due to the pain.  Some improvement with motrin- but brief.  She notes that the pain is "like a nerve pain."  Notes that she has had chronic back bain- difficulty walking up straight.  Notes "poor circulation in the right hand and right leg."    Also concerned about acne.  Doesn't think that the doxycycline is helping.  Review of Systems  Constitutional: Negative for fever.       >30 lb weight loss with healthy weight loss since 12/11.  Respiratory: Positive for shortness of breath and wheezing.        Some shortness with walking  Cardiovascular: Positive for leg swelling. Negative for chest pain.   Past Medical History  Diagnosis Date  . Asthma   . Obesity   . Diverticulosis   . Arthritis   . Borderline diabetes   . Glaucoma   . History of DVT of lower extremity 1990s    History   Social History  . Marital Status: Divorced    Spouse Name: N/A    Number of Children: 0  . Years of Education: N/A   Occupational History  . Retired    Social History Main Topics  . Smoking status: Never Smoker   . Smokeless tobacco: Never Used  . Alcohol Use: No  . Drug Use: No  . Sexually Active: Not on file   Other Topics Concern  . Not on file   Social History Narrative   Regular exercise: noCaffeine Use:  1-2 drinks daily    Past Surgical History  Procedure Date  . Oophorectomy   . Supranumerary nipple excision     right breast  . Abdominal hysterectomy 2006  . Breast biopsy 2008     Family History  Problem Relation Age of Onset  . Heart disease Mother     CHF  . Arthritis Mother   . Diabetes Mother   . COPD Father   . Cancer Father     lung  . Kidney disease Sister     kidney  failure    Allergies  Allergen Reactions  . Penicillins     REACTION: rash, itching    Current Outpatient Prescriptions on File Prior to Visit  Medication Sig Dispense Refill  . doxycycline (VIBRA-TABS) 100 MG tablet TAKE 1 TABLET BY MOUTH ONCE A DAY  30 tablet  6  . Multiple Vitamin (MULTIVITAMIN) tablet Take 1 tablet by mouth daily.        Marland Kitchen DISCONTD: naproxen sodium (ANAPROX) 220 MG tablet Take 2 tablets once a day.       Marland Kitchen DISCONTD: adapalene (DIFFERIN) 0.1 % gel Apply to face at bedtime.       Marland Kitchen DISCONTD: albuterol (PROAIR HFA) 108 (90 BASE) MCG/ACT inhaler Inhale 2 puffs into the lungs 4 (four) times daily as needed.          BP 120/70  Pulse 78  Temp(Src) 98 F (36.7 C) (Oral)  Resp 20  Ht 5' (1.524 m)  Wt 299 lb (135.626 kg)  BMI 58.39 kg/m2  SpO2 99%       Objective:   Physical Exam  Constitutional:  Morbidly obese AA female, awake, alert and in NAD  Eyes: Pupils are equal, round, and reactive to light.  Neck: Neck supple.  Cardiovascular: Normal rate and regular rhythm.   Pulmonary/Chest: Effort normal and breath sounds normal.  Abdominal: Soft.  Musculoskeletal:       Some pain with passive ROM of right shoulder.  Neg drop test. Decreased passive ROM of right hip.  Bilateral upper extremity and lower extremity strength is 5/5  Skin:       Cystic acne with scarring noted on face- mainly on lower cheeks.          Assessment & Plan:

## 2011-02-15 NOTE — Patient Instructions (Signed)
Please complete your x-rays on the first floor.

## 2011-02-16 ENCOUNTER — Other Ambulatory Visit: Payer: Self-pay | Admitting: Family

## 2011-02-16 ENCOUNTER — Ambulatory Visit (HOSPITAL_BASED_OUTPATIENT_CLINIC_OR_DEPARTMENT_OTHER)
Admission: RE | Admit: 2011-02-16 | Discharge: 2011-02-16 | Disposition: A | Discharge status: Home / Self Care | Payer: Federal, State, Local not specified - PPO | Source: Ambulatory Visit | Attending: Family | Admitting: Family

## 2011-02-16 ENCOUNTER — Ambulatory Visit (INDEPENDENT_AMBULATORY_CARE_PROVIDER_SITE_OTHER)
Admission: RE | Admit: 2011-02-16 | Discharge: 2011-02-16 | Disposition: A | Discharge status: Home / Self Care | Payer: Federal, State, Local not specified - PPO | Source: Ambulatory Visit | Attending: Family | Admitting: Family

## 2011-02-16 ENCOUNTER — Telehealth: Payer: Self-pay | Admitting: Family

## 2011-02-16 DIAGNOSIS — R52 Pain, unspecified: Secondary | ICD-10-CM

## 2011-02-16 DIAGNOSIS — M79609 Pain in unspecified limb: Secondary | ICD-10-CM | POA: Insufficient documentation

## 2011-02-16 DIAGNOSIS — M25519 Pain in unspecified shoulder: Secondary | ICD-10-CM | POA: Insufficient documentation

## 2011-02-16 DIAGNOSIS — M545 Low back pain, unspecified: Secondary | ICD-10-CM | POA: Insufficient documentation

## 2011-02-16 DIAGNOSIS — M19019 Primary osteoarthritis, unspecified shoulder: Secondary | ICD-10-CM | POA: Insufficient documentation

## 2011-02-16 DIAGNOSIS — M47817 Spondylosis without myelopathy or radiculopathy, lumbosacral region: Secondary | ICD-10-CM | POA: Insufficient documentation

## 2011-02-16 NOTE — Telephone Encounter (Signed)
PATIENT CALLED TO ADVISE SHE ALREADY HAD HER PAP AND MAMMOGRAM DONE THIS MONTH BY HER GYN DOC DR HARVATH. THE PAP ON MARCH 29 AND THE MAMMOGRAM ON MARCH 21

## 2011-02-22 LAB — CBC
HCT: 36.6 % (ref 36.0–46.0)
Hemoglobin: 12.6 g/dL (ref 12.0–15.0)
RBC: 4.22 MIL/uL (ref 3.87–5.11)
WBC: 5.9 10*3/uL (ref 4.0–10.5)

## 2011-02-22 LAB — DIFFERENTIAL
Eosinophils Relative: 3 % (ref 0–5)
Lymphocytes Relative: 30 % (ref 12–46)
Lymphs Abs: 1.8 10*3/uL (ref 0.7–4.0)
Monocytes Absolute: 0.4 10*3/uL (ref 0.1–1.0)

## 2011-02-22 LAB — BASIC METABOLIC PANEL
GFR calc non Af Amer: >60 mL/min (ref >60)
Potassium: 4.5 mEq/L (ref 3.5–5.1)
Sodium: 140 mEq/L (ref 135–145)

## 2011-02-23 ENCOUNTER — Ambulatory Visit (HOSPITAL_COMMUNITY): Payer: Federal, State, Local not specified - PPO

## 2011-03-09 ENCOUNTER — Encounter: Payer: Self-pay | Admitting: Family

## 2011-03-09 ENCOUNTER — Ambulatory Visit (INDEPENDENT_AMBULATORY_CARE_PROVIDER_SITE_OTHER): Payer: Federal, State, Local not specified - PPO | Admitting: Family

## 2011-03-09 DIAGNOSIS — L708 Other acne: Secondary | ICD-10-CM

## 2011-03-09 DIAGNOSIS — M1990 Unspecified osteoarthritis, unspecified site: Secondary | ICD-10-CM | POA: Insufficient documentation

## 2011-03-09 NOTE — Assessment & Plan Note (Signed)
The patient has had some improvement with the use of acetaminophen. Her greatest problem at this time is her morbid obesity. We spoke at length today about the importance of healthy diet and exercise. I have advised her to try to keep her calorie intake to 1200-1500 calories a day. We will see her back in 3 months.

## 2011-03-09 NOTE — Patient Instructions (Signed)
Keep a log of your exercise and calories.  Try to limit your calories to 1200-1500 a day. You can try an online calorie counter such as www.sparkpeople.com or www.weight http://dawson-may.[email or web address]/. Follow up in 3 months so that we can follow up on your weight loss.

## 2011-03-09 NOTE — Progress Notes (Signed)
Subjective:    Patient ID: Carla Nash, female    DOB: 1946/05/14, 65 y.o.   MRN: [id number]  HPI  Carla Nash is a 65 year old female who presents today for followup.   #1 osteoarthritis-last visit she complains of right shoulder pain and right hip pain. Last visit she underwent an x-ray of the right shoulder which noted degenerative change involving the inferior aspect of the right glenohumeral joint. She also had an x-ray performed of the lumbar spine which noted degenerative disease of the lower lumbar spine as well as progressive degenerative change about the sacroiliac joints-worse on the right. She was prescribed Aleve which she reports provided her with very little relief. She then tried acetaminophen and notes that this has helped some with her pain.  #2 cystic acne-she continues doxycycline and and since last visit has been using a clindamycin/benzoyl peroxide gel which he feels has been helping her acne.  #3 obesity-she reports that her diet is generally healthy. She eats fruits salads and low fat yogurt predominantly she reports that they bake the food in the home they do not eat fried food. She does do some walking around her bra and she as well as some floor leg lifts.  Review of Systems See history of present illness     Past Medical History  Diagnosis Date  . Asthma   . Obesity   . Diverticulosis   . Arthritis   . Borderline diabetes   . Glaucoma   . History of DVT of lower extremity 1990s    History   Social History  . Marital Status: Divorced    Spouse Name: N/A    Number of Children: 0  . Years of Education: N/A   Occupational History  . Retired    Social History Main Topics  . Smoking status: Never Smoker   . Smokeless tobacco: Never Used  . Alcohol Use: No  . Drug Use: No  . Sexually Active: Not on file   Other Topics Concern  . Not on file   Social History Narrative   Regular exercise: noCaffeine Use:  1-2 drinks daily    Past Surgical  History  Procedure Date  . Oophorectomy   . Supranumerary nipple excision     right breast  . Abdominal hysterectomy 2006  . Breast biopsy 2008     Family History  Problem Relation Age of Onset  . Heart disease Mother     CHF  . Arthritis Mother   . Diabetes Mother   . COPD Father   . Cancer Father     lung  . Kidney disease Sister     kidney failure    Allergies  Allergen Reactions  . Penicillins     REACTION: rash, itching    Current Outpatient Prescriptions on File Prior to Visit  Medication Sig Dispense Refill  . clindamycin-benzoyl peroxide (BENZACLIN) gel Apply to affected area 2 times daily  25 g  2  . doxycycline (VIBRA-TABS) 100 MG tablet TAKE 1 TABLET BY MOUTH ONCE A DAY  30 tablet  6  . Multiple Vitamin (MULTIVITAMIN) tablet Take 1 tablet by mouth daily.        Marland Kitchen DISCONTD: naproxen sodium (ANAPROX) 220 MG tablet Take 1 tablet (220 mg total) by mouth 2 (two) times daily as needed. Take 2 tablets once a day.  60 tablet  0    BP 120/80  Pulse 84  Temp(Src) 98.2 F (36.8 C) (Oral)  Resp 24  Ht 5' (1.524  m)  Wt 302 lb (136.986 kg)  BMI 58.98 kg/m2    Objective:   Physical Exam General morbidly obese African American female awake and alert pleasant and in no acute distress Cardiovascula:r S1-S2 regular rate and rhythm no murmurs noted Respiratory:  breath sounds are clear to auscultation bilaterally without wheezes rales or rhonchi.        Assessment & Plan:

## 2011-03-09 NOTE — Assessment & Plan Note (Signed)
Clinically improving with addition of gel. Continue doxycycline and plus gel.

## 2011-03-22 NOTE — Op Note (Signed)
NAME:  Carla Nash, Carla Nash              ACCOUNT NO.:  [id number]   MEDICAL RECORD NO.:  [id number]          PATIENT TYPE:  AMB   LOCATION:  DSC                          FACILITY:  [facility]   PHYSICIAN:  Wilmon Arms. Corliss Skains, M.D. DATE OF BIRTH:  19-May-1946   DATE OF PROCEDURE:  12/24/2008  DATE OF DISCHARGE:                               OPERATIVE REPORT   PREOPERATIVE DIAGNOSIS:  Abnormal right mammogram.   POSTOPERATIVE DIAGNOSIS:  Abnormal right mammogram.   PROCEDURE PERFORMED:  Right needle-localized lumpectomy.   SURGEON:  Wilmon Arms. Tsuei, MD   ANESTHESIA:  General.   INDICATIONS:  The patient is a 65 year old female who is morbidly obese  who had a routine screening mammogram.  She was noted to have abnormal  cluster of microcalcifications in the right upper outer quadrant of the  breast.  The patient is too large to fit on the stereotactic biopsy  table, so she was referred for surgical biopsy.  She has no palpable  lesions.  She underwent needle localization and was brought to the  operating room.   DESCRIPTION OF PROCEDURE:  The patient was brought to the operating room  and placed in the supine position on operating room table with her right  arm extended.  After an adequate level of general anesthesia was  obtained, her right breast was prepped with Betadine and draped in  sterile fashion.  The area around the wire insertion site was  infiltrated with 0.25% Marcaine with epinephrine.  We made an elliptical  incision around the wire.  Skin flaps were raised in all directions.  According to the mammogram, the wire extended about 7.7 cm deep to get  past the lesion.  We took a wide cylinder of tissue around the wire.  We  had to enlarge our incision slightly and get larger Richardson  retractors to provide adequate exposure.  We also used a cautery  extender.  We were finally able to dissect down past the wire.  I was  able to see the tip of the wire in the edge of the specimen,  but I was  safely past this area.  One large vessel was encountered and this was  controlled with cautery.  The specimen was amputated and was oriented  using the paint kit.  A specimen mammogram was then obtained which  showed that we indeed had the calcifications inside the specimen.  This  was confirmed by Dr. Deboraha Sprang of Radiology.  The wound was then thoroughly  irrigated.  It was closed with a deep layer of 3-0 Vicryl and a  subcuticular 4-0 Monocryl.  Steri-Strips and clean dressings were  applied.  The patient was extubated and brought to recovery in stable  condition.  All sponge, instrument, and needle counts were correct.       Wilmon Arms. Tsuei, M.D.  Electronically Signed     MKT/MEDQ  D:  12/24/2008  T:  12/24/2008  Job:  [id number]

## 2011-03-25 NOTE — Discharge Summary (Signed)
NAME:  Carla Nash, Carla Nash              ACCOUNT NO.:  [id number]   MEDICAL RECORD NO.:  [id number]          PATIENT TYPE:  INP   LOCATION:  9312                          FACILITY:  [facility]   PHYSICIAN:  Carrington Clamp, M.D. DATE OF BIRTH:  1946-02-11   DATE OF ADMISSION:  02/14/2006  DATE OF DISCHARGE:  02/17/2006                                 DISCHARGE SUMMARY   ADMISSION DIAGNOSES:  Postmenopausal bleeding and symptomatic fibroid  uterus.   DISCHARGE DIAGNOSES:  1.  Symptomatic fibroid uterus.  2.  Complex atypical endometrial hyperplasia.   PERTINENT PROCEDURES PERFORMED:  Total abdominal hysterectomy with bilateral  salpingo-oophorectomy after a D&C, frozen section.   HISTORY OF PRESENT ILLNESS:  Please refer to dictated History and Physical  on chart.  Briefly, this is a 65 year old G0 who presented with pain in  January and found to have a massive fibroid uterus with thickened  endometrial stripe and postmenopausal bleeding.  The patient desires  definitive therapy.   On February 14, 2006, the patient was taken to the operating room for a  dilation and curettage.  We were unable to determine pathology of the  endometrial lining in the office, and so a D&C was planned with frozen  section prior to the hysterectomy.  The frozen section indicated no cancer,  and, therefore, the hysterectomy was proceeded with.  She underwent the  total abdominal hysterectomy with bilateral salpingo-oophorectomy on February 14, 2006, without complication, with a vertical incision.  The patient had a  Jackson-Pratt placed intraoperatively.   By postop day #2, the patient was eating and ambulating.  However, the  patient had to have a postvoid residual catheter on postop day #1, and we  were uncertain whether or not she was able to void and empty her bladder  adequately on postop day #2.  Pain control was also an issue on postop day  #2.  The Jackson-Pratt drain was still putting out a fair amount of  fluid,  and, therefore, the patient was held over to postop day #3.   On postop day #3, the Jackson-Pratt drain was removed.  The incision was  intact.  The patient was eating, ambulating, voiding, and walking better  with metered dose inhaler as well as pain control being better . The patient  was discharged with the following.   ACTIVITY:  Walk with assistance.  No driving for 4 weeks, no lifting for 6  weeks.  No sexual activity for 6 weeks.  Increase activity slowly.  May  shower but no bathing and walk up steps.   WOUND CARE:  May shower only and keep dry.   MEDICATIONS:  1.  Percocet 5 mg 1 p.o. q. 4-6 h p.r.n. pain.  2.  Colace over-the-counter.  3.  Motrin 800 mg q 8 h.   The patient was to call for increased temperature, pain not resolved with  medications, or increased drainage from incision site.  The patient was to  return to Dr. Henderson Cloud 7 days from surgery for staples to be removed.   Preoperative hemoglobin 12.8, hematocrit 37.  Postoperatively hemoglobin was  10, hematocrit 29.9.      Carrington Clamp, M.D.  Electronically Signed     MH/MEDQ  D:  04/04/2006  T:  04/04/2006  Job:  [id number]

## 2011-03-25 NOTE — H&P (Signed)
NAME:  Carla Nash, Carla Nash NO.:  [id number]   MEDICAL RECORD NO.:  [id number]          PATIENT TYPE:  AMB   LOCATION:  SDC                           FACILITY:  [facility]   PHYSICIAN:  Carrington Clamp, M.D. DATE OF BIRTH:  [birth year]   DATE OF ADMISSION:  02/14/2006  DATE OF DISCHARGE:                                HISTORY & PHYSICAL   CHIEF COMPLAINT:  This is a 65 year old G0 who presented with pain in  January and found to have a massive fibroid uterus with a thickened  endometrial stripe.  Desires definitive therapy.   HISTORY OF PRESENT ILLNESS:  The patient presented in January with pain in  the pelvic area and had a CT scan at [facility].  They noted  fibroids on the uterus and a cyst on the kidney but no other abnormalities.  The patient has had periods off and on for many years but they have been  becoming increasing in crampiness.  The patient has not been on hormone  replacement therapy.  The patient has not stopped the periods despite the  fact that she is 65 years old.  She has had no other PMS symptoms.  The  patient had an infection in her left breast nipple otherwise has been in  good health.   PAST MEDICAL HISTORY:  The patient has borderline diabetes but is treated  with diet alone.  The patient has asthma, for which she takes Advair.  Her  blood pressures have been mildly elevated but she has not had any medication  for hypertension.  The patient underwent an evaluation by Dr. Cato Mulligan and a  stress test on February 03, 2006.   PAST SURGICAL HISTORY:  None.   PAST GYNECOLOGIC HISTORY:  Negative for abnormal Pap smears or pelvic  infections.   PAST OBSTETRICAL HISTORY:  None.   MEDICATIONS:  Advair 100/50 mcg one puff twice a day.   ALLERGIES:  PENICILLIN, but the patient can take Keflex without a reaction.   The patient does not smoke.   PHYSICAL EXAMINATION:  VITAL SIGNS:  Blood pressure 130/90.  HEENT:  Normal and anicteric.  NECK:   Without thyromegaly.  CARDIAC:  Regular rate and rhythm.  LUNGS:  Clear to auscultation bilaterally.  ABDOMEN:  Soft, nontender, obese, no masses felt initially.  RECTAL:  Guaiac negative.  PELVIC:  Normal external genitalia, vagina and cervix. The uterus felt 15  weeks' size and bulky but mobile.  Adnexa were unable to be palpated.   Pap smear was negative for abnormalities.  Ultrasound revealed a 17 x 14 x  10 cm size uterus.  Endometrial thickness was 3.8 cm with small cystic  areas.  There was an 8.6 cm calcified fibroid seen.  Neither ovary was seen  otherwise.   The patient underwent a biopsy to rule out the possibility of endometrial  cancer.  Pathology of the endometrial biopsy, however, revealed scant  cellularity and insufficient amount of tissue for a diagnosis despite the  fact that there was no abnormality seen on the tissue sample.  CT exam done  in January revealed  no acute abnormalities, no adnexal masses or enlarged  lymph nodes, and only the enlarged fibroid uterus.   ASSESSMENT:  This is a 65 year old woman with postmenopausal bleeding,  enlarged fibroid uterus and thickened endometrial lining unable to evaluate  by endometrial biopsy.  Ultrasound assessment shows a markedly thickened  endometrial lining and we were unable to definitively rule out endometrial  cancer.  The patient will undergo a D & C with a frozen section followed by  a total abdominal hysterectomy and bilateral salpingo-oophorectomy if the  frozen section does not indicate cancer.  All risks, benefits, and  alternatives have been discussed with the patient.  The patient has  undergone a bowel prep in anticipation of the total abdominal hysterectomy.  The patient understands that should the frozen section show endometrial  cancer, that we will abandon the procedure and she will be awakened and we  will set her up with gynecologic oncology for further evaluation.  If,  however, the frozen section is  benign, we will proceed with a total  abdominal hysterectomy with bilateral salpingo-oophorectomy.  The patient,  although is allergic to PENICILLIN, can take Ancef and therefore will be  given preoperative antibiotics and SCDs during surgery.  The patient had  been evaluated by Dr. Cato Mulligan for surgical fitness and no acute abnormalities  were seen.      Carrington Clamp, M.D.  Electronically Signed     MH/MEDQ  D:  02/14/2006  T:  02/14/2006  Job:  [id number]

## 2011-03-25 NOTE — Op Note (Signed)
NAME:  Carla Nash, Carla Nash NO.:  [id number]   MEDICAL RECORD NO.:  [id number]          PATIENT TYPE:  INP   LOCATION:  9399                          FACILITY:  [facility]   PHYSICIAN:  Carrington Clamp, M.D. DATE OF BIRTH:  May 25, 1946   DATE OF PROCEDURE:  02/14/2006  DATE OF DISCHARGE:                                 OPERATIVE REPORT   PREOPERATIVE DIAGNOSES:  1.  Postmenopausal bleeding with thickened endometrial stripe, unable to      evaluate in office.  2.  Enlarged and symptomatic fibroid uterus.   POSTOPERATIVE DIAGNOSES:  1.  Postmenopausal bleeding with thickened endometrial stripe, unable to      evaluate in office.  2.  Enlarged and symptomatic fibroid uterus.   PROCEDURE:  1.  Dilation and curettage and frozen suction of endometrium.  2.  Total abdominal hysterectomy with bilateral salpingo-oophorectomy.   SURGEON:  Carrington Clamp, M.D.   ANESTHESIA:  Randye Lobo, M.D.   ANESTHESIA:  Anesthesia was general.   SPECIMENS:  Uterus, cervix, ovaries and tubes.  The weight of the uterus was  1238 grams.  There is a frozen section of the uterine curettings sent at the  beginning of the operation.   ESTIMATED BLOOD LOSS:  Estimated blood loss was 400 mL.   IV FLUIDS:  2000 mL.   URINE OUTPUT:  200 mL.   DRAINS:  One Al Pimple.   COMPLICATIONS:  None.   FINDINGS:  mall amount of tissue on dilation and curettage.  Frozen section  was examined by Dr. Terie Purser and benign endocervical glands with some  myometrial tissue was seen but no evidence of endometrial cancer.  There is  a 15-week size uterus with multiple large fibroids seen.  The ovaries and  tubes were both normal bilaterally.  The ureters were able to be palpated  multiple times during the procedure and well out of the field dissection  bilaterally. There were not directly visualized.   MEDICATIONS:  Ancef.   COUNTS:  Counts were correct x3.   TECHNIQUE:  After adequate general  anesthesia was achieved, the patient  prepped and draped using sterile fashion in dorsal lithotomy position.  The  bladder was emptied with a red rubber catheter. A speculum placed in the  vagina.  The single-tooth knife was used for traction on the cervix, and the  cervix was dilated with Shawnie Pons dilators.  The curettings were performed with  sharp curettage, and all instruments were then withdrawn from the vagina.  The specimen was sent to pathology, and the frozen section indicated with no  signs of cancer.  Therefore, the patient was repositioned to supine with a  pillow under her knees, and her abdomen was prepped and draped in the usual  sterile fashion.  After regowning and gloving, a vertical incision was made  with a scalpel through skin down through the skin.  The subcutaneous tissue  was incised down to the fascia with the Bovie cautery.  The fascia was  incised in midline in a vertical manner with the scalpel.  The Mayo scissors  were used to  increase the incision in superior/inferior manner.  The fascia  was grasped with a pair of Kocher's, the midline found and the rectus  muscles separated.  A bowel-free portion of peritoneum was entered into  bluntly, and the peritoneum was then incised in superior and inferior manner  with good visualization of bowel and bladder.   The Bookwalter retractor was placed.  The bowel was packed away with four  wet laps.  The uterus was grasped with a pair of Kelly clamps, and the round  ligaments were secured with the suture transfixion stitch of the zero Vicryl  bilaterally.  Each pedicle was incised with a Bovie cautery.  The Bovie  cautery was used to incise the vesicouterine fascia, best helping to create  the bladder flap.  Avascular portion of the posterior leaf of broad ligament  was entered into bilaterally with the Bovie cautery, and Heaney's were  placed over the infundibular pelvic ligaments once the ureters were noted to  be well out  of the field of dissection.  Each pedicle was secured with a  freehand tie of 0 Vicryl followed by a stitch of zero Vicryl.  The bladder  flap was then created with blunt and sharp dissection with the Metzenbaum  scissors, and the uterine arteries were skeletonized.  The uterine arteries  were secured bilaterally with a Heaney clamp at the level of the internal os  bilaterally.  Each pedicle was incised with the Mayo scissors and suture  transfixed with 0 Vicryl.  The uterine specimen was then able to be  amputated with the scalpel and then the cervical stump grasped with a pair  of Kocher's.  The bladder flap was then created with additional sharp  dissection with the Metzenbaum scissors, and once the bladder was retracted  out the field of dissection, the ureters were reinspected and found to be  out of the field dissection.  The cardinal ligament was then divided.  Alternating successive bites of the straight Heaney clamp were used.  Each  pedicle was incised with a scalpel and secured to stitch of 0 Vicryl.  At  the level of the reflection of the vagina at the cervix, a pair of Heaney's  were placed bilaterally, and each pedicle was incised with the Mayo scissors  and secured with a Heaney stitch of 0 Vicryl.  An additional figure-of-eight  stitch of 0 Vicryl was used to close the cuff, including the posterior  peritoneum, and the pelvis was then irrigated.  A small amount of bleeding  on the right-hand side peritoneum which was likely a portion of the  posterior leaf of broad ligament was taking care of with two figure-of-eight  stitches of 0 Vicryl after they had been grasped with a right angle clamp.  The ureters were palpated to be out of the field dissection, both before and  after each of these stitches had been placed.   All instruments were then withdrawn from the abdomen, and the peritoneum closed with a running stitch of 2-0 Vicryl with the assistance of a  malleable blade.   The fascia was then closed with running stitch of looped  PDS.  The subcutaneous tissue was rendered hemostatic with the Bovie cautery  and irrigation.  A Jackson-Pratt drain was placed in the subcutaneous tissue  and passed through a separate incision on the right-hand side of the  patient.  The  subcutaneous tissue was closed with interrupted stitches of 2-0 plain gut.  The skin was closed with  staples.  The Jackson-Pratt drain was secured in  place with 2-0 nylon and then the bulb placed on the end of the drain.  The  patient tolerated the procedure well and was returned to recovery room in  stable condition.      Carrington Clamp, M.D.  Electronically Signed     MH/MEDQ  D:  02/14/2006  T:  02/14/2006  Job:  [id number]

## 2011-04-20 ENCOUNTER — Telehealth: Payer: Self-pay | Admitting: Internal Medicine

## 2011-04-20 NOTE — Telephone Encounter (Signed)
Pt states that she would like to switch back to Dr. Cato Mulligan.

## 2011-04-20 NOTE — Telephone Encounter (Signed)
That is fine with me.  She can arrange a follow up at [facility]. Please call pt and notify her.

## 2011-04-22 NOTE — Telephone Encounter (Signed)
Patient notified

## 2011-05-12 ENCOUNTER — Ambulatory Visit: Payer: Federal, State, Local not specified - PPO | Admitting: Internal Medicine

## 2011-06-06 ENCOUNTER — Ambulatory Visit: Payer: Federal, State, Local not specified - PPO | Admitting: Family

## 2011-10-16 ENCOUNTER — Other Ambulatory Visit: Payer: Self-pay | Admitting: Internal Medicine

## 2011-10-20 ENCOUNTER — Ambulatory Visit (INDEPENDENT_AMBULATORY_CARE_PROVIDER_SITE_OTHER): Payer: Federal, State, Local not specified - PPO | Admitting: Internal Medicine

## 2011-10-20 DIAGNOSIS — Z23 Encounter for immunization: Secondary | ICD-10-CM

## 2012-02-21 ENCOUNTER — Other Ambulatory Visit (INDEPENDENT_AMBULATORY_CARE_PROVIDER_SITE_OTHER): Payer: Federal, State, Local not specified - PPO

## 2012-02-21 DIAGNOSIS — Z0000 Encounter for general adult medical examination without abnormal findings: Secondary | ICD-10-CM

## 2012-02-21 LAB — POCT URINALYSIS DIPSTICK
Bilirubin, UA: NEGATIVE
Ketones, UA: NEGATIVE
Leukocytes, UA: NEGATIVE
Nitrite, UA: NEGATIVE
Protein, UA: NEGATIVE
Urobilinogen, UA: 0.2

## 2012-02-21 LAB — CBC WITH DIFFERENTIAL/PLATELET
Basophils Absolute: 0.1 10*3/uL (ref 0.0–0.1)
Basophils Relative: 0.8 % (ref 0.0–3.0)
Eosinophils Absolute: 0.2 10*3/uL (ref 0.0–0.7)
Hemoglobin: 12.2 g/dL (ref 12.0–15.0)
Lymphocytes Relative: 35 % (ref 12.0–46.0)
MCHC: 33 g/dL (ref 30.0–36.0)
Monocytes Absolute: 0.4 10*3/uL (ref 0.1–1.0)
Neutro Abs: 4 10*3/uL (ref 1.4–7.7)
RDW: 14.9 % — ABNORMAL HIGH (ref 11.5–14.6)

## 2012-02-21 LAB — HEPATIC FUNCTION PANEL
Albumin: 4.4 g/dL (ref 3.5–5.2)
Alkaline Phosphatase: 78 U/L (ref 39–117)

## 2012-02-21 LAB — BASIC METABOLIC PANEL
CO2: 26 mEq/L (ref 19–32)
Calcium: 10.6 mg/dL — ABNORMAL HIGH (ref 8.4–10.5)
Creatinine, Ser: 0.7 mg/dL (ref 0.4–1.2)
Glucose, Bld: 97 mg/dL (ref 70–99)

## 2012-02-21 LAB — MICROALBUMIN / CREATININE URINE RATIO
Microalb Creat Ratio: 2 mg/g (ref 0.0–30.0)
Microalb, Ur: 1.5 mg/dL (ref 0.0–1.9)

## 2012-02-21 LAB — HEMOGLOBIN A1C: Hgb A1c MFr Bld: 5.3 % (ref 4.6–6.5)

## 2012-02-21 LAB — LIPID PANEL
Total CHOL/HDL Ratio: 5
VLDL: 30.6 mg/dL (ref 0.0–40.0)

## 2012-02-21 LAB — TSH: TSH: 1.33 u[IU]/mL (ref 0.35–5.50)

## 2012-02-21 LAB — LDL CHOLESTEROL, DIRECT: Direct LDL: 172.9 mg/dL

## 2012-03-16 ENCOUNTER — Encounter: Payer: Self-pay | Admitting: Internal Medicine

## 2012-03-16 ENCOUNTER — Ambulatory Visit (INDEPENDENT_AMBULATORY_CARE_PROVIDER_SITE_OTHER): Payer: Federal, State, Local not specified - PPO | Admitting: Internal Medicine

## 2012-03-16 VITALS — BP 144/96 | HR 72 | Temp 97.6°F | Ht <= 58 in | Wt 306.0 lb

## 2012-03-16 DIAGNOSIS — I10 Essential (primary) hypertension: Secondary | ICD-10-CM | POA: Insufficient documentation

## 2012-03-16 DIAGNOSIS — E785 Hyperlipidemia, unspecified: Secondary | ICD-10-CM

## 2012-03-16 DIAGNOSIS — Z2911 Encounter for prophylactic immunotherapy for respiratory syncytial virus (RSV): Secondary | ICD-10-CM

## 2012-03-16 DIAGNOSIS — Z0000 Encounter for general adult medical examination without abnormal findings: Secondary | ICD-10-CM

## 2012-03-16 DIAGNOSIS — R030 Elevated blood-pressure reading, without diagnosis of hypertension: Secondary | ICD-10-CM

## 2012-03-16 DIAGNOSIS — Z23 Encounter for immunization: Secondary | ICD-10-CM

## 2012-03-16 MED ORDER — ACETAMINOPHEN 500 MG PO TABS: 500.0000 mg | ORAL_TABLET | Freq: Three times a day (TID) | ORAL | Status: DC

## 2012-03-16 NOTE — Progress Notes (Signed)
Patient ID: Carla Nash, female   DOB: [birth year], 66 y.o.   MRN: [id number] CPX  Morbidly obese  Past Medical History  Diagnosis Date  . Asthma   . Obesity   . Diverticulosis   . Arthritis   . Borderline diabetes   . Glaucoma   . History of DVT of lower extremity 1990s    History   Social History  . Marital Status: Divorced    Spouse Name: N/A    Number of Children: 0  . Years of Education: N/A   Occupational History  . Retired    Social History Main Topics  . Smoking status: Never Smoker   . Smokeless tobacco: Never Used  . Alcohol Use: No  . Drug Use: No  . Sexually Active: Not on file   Other Topics Concern  . Not on file   Social History Narrative   Regular exercise: noCaffeine Use:  1-2 drinks daily    Past Surgical History  Procedure Date  . Oophorectomy   . Supranumerary nipple excision     right breast  . Abdominal hysterectomy 2006  . Breast biopsy 2008     Family History  Problem Relation Age of Onset  . Heart disease Mother     CHF  . Arthritis Mother   . Diabetes Mother   . COPD Father   . Cancer Father     lung  . Kidney disease Sister     kidney failure    Allergies  Allergen Reactions  . Penicillins     REACTION: rash, itching    Current Outpatient Prescriptions on File Prior to Visit  Medication Sig Dispense Refill  . acetaminophen (TYLENOL) 500 MG tablet Take 500 mg by mouth 2 (two) times daily. As needed for arthritis pain.       . Multiple Vitamin (MULTIVITAMIN) tablet Take 1 tablet by mouth daily.           patient denies chest pain, shortness of breath, orthopnea. Denies lower extremity edema, abdominal pain, change in appetite, change in bowel movements. Patient denies rashes, musculoskeletal complaints. No other specific complaints in a complete review of systems.   BP 144/96  Pulse 72  Temp(Src) 97.6 F (36.4 C) (Oral)  Ht 4\' 10"  (1.473 m)  Wt 306 lb (138.801 kg)  BMI 63.95 kg/m2  SpO2 93%  Well-developed  well-nourished female in no acute distress. HEENT exam atraumatic, normocephalic, extraocular muscles are intact. Neck is supple. No jugular venous distention no thyromegaly. Chest clear to auscultation without increased work of breathing. Cardiac exam S1 and S2 are regular. Abdominal exam active bowel sounds, soft, nontender. Extremities no edema. Neurologic exam she is alert without any motor sensory deficits. Gait is normal.   Well visit-- health maint UTD. Desperately needs to lose weight-- she voices understanding but has no plans for weight loss. She refuses nutrition referal.  BP--- needs to lose weight  Lipids--- needs to lose weight and follow diet. Recheck bp and lipids in 3months

## 2013-10-18 ENCOUNTER — Other Ambulatory Visit: Payer: Federal, State, Local not specified - PPO

## 2013-11-05 ENCOUNTER — Encounter: Payer: Federal, State, Local not specified - PPO | Admitting: Internal Medicine

## 2013-11-13 DIAGNOSIS — M25519 Pain in unspecified shoulder: Secondary | ICD-10-CM | POA: Diagnosis not present

## 2013-11-13 DIAGNOSIS — M129 Arthropathy, unspecified: Secondary | ICD-10-CM | POA: Diagnosis not present

## 2013-11-13 DIAGNOSIS — J351 Hypertrophy of tonsils: Secondary | ICD-10-CM | POA: Diagnosis not present

## 2013-11-18 ENCOUNTER — Encounter: Payer: Federal, State, Local not specified - PPO | Admitting: Internal Medicine

## 2014-01-07 ENCOUNTER — Encounter: Payer: Self-pay | Admitting: Family

## 2014-01-07 ENCOUNTER — Ambulatory Visit (INDEPENDENT_AMBULATORY_CARE_PROVIDER_SITE_OTHER): Payer: Medicare Other | Admitting: Family

## 2014-01-07 VITALS — BP 142/88 | HR 93 | Ht 59.0 in | Wt 296.4 lb

## 2014-01-07 DIAGNOSIS — Z23 Encounter for immunization: Secondary | ICD-10-CM | POA: Diagnosis not present

## 2014-01-07 DIAGNOSIS — M1990 Unspecified osteoarthritis, unspecified site: Secondary | ICD-10-CM | POA: Diagnosis not present

## 2014-01-07 DIAGNOSIS — Z0000 Encounter for general adult medical examination without abnormal findings: Secondary | ICD-10-CM

## 2014-01-07 LAB — HEPATIC FUNCTION PANEL
ALBUMIN: 4.3 g/dL (ref 3.5–5.2)
ALK PHOS: 80 U/L (ref 39–117)
ALT: 22 U/L (ref 0–35)
AST: 22 U/L (ref 0–37)
Bilirubin, Direct: 0 mg/dL (ref 0.0–0.3)
TOTAL PROTEIN: 8.5 g/dL — AB (ref 6.0–8.3)
Total Bilirubin: 0.5 mg/dL (ref 0.3–1.2)

## 2014-01-07 LAB — BASIC METABOLIC PANEL
BUN: 12 mg/dL (ref 6–23)
CALCIUM: 10.6 mg/dL — AB (ref 8.4–10.5)
CO2: 28 meq/L (ref 19–32)
CREATININE: 0.7 mg/dL (ref 0.4–1.2)
Chloride: 104 mEq/L (ref 96–112)
GFR: 106.18 mL/min (ref >60.00)
GLUCOSE: 89 mg/dL (ref 70–99)
Potassium: 4.1 mEq/L (ref 3.5–5.1)
Sodium: 138 mEq/L (ref 135–145)

## 2014-01-07 LAB — CBC WITH DIFFERENTIAL/PLATELET
BASOS ABS: 0 10*3/uL (ref 0.0–0.1)
Basophils Relative: 0.6 % (ref 0.0–3.0)
Eosinophils Absolute: 0.1 10*3/uL (ref 0.0–0.7)
Eosinophils Relative: 1.8 % (ref 0.0–5.0)
HCT: 38.1 % (ref 36.0–46.0)
HEMOGLOBIN: 12.3 g/dL (ref 12.0–15.0)
LYMPHS PCT: 39.6 % (ref 12.0–46.0)
Lymphs Abs: 3.1 10*3/uL (ref 0.7–4.0)
MCHC: 32.3 g/dL (ref 30.0–36.0)
MCV: 90.7 fl (ref 78.0–100.0)
MONOS PCT: 5.7 % (ref 3.0–12.0)
Monocytes Absolute: 0.4 10*3/uL (ref 0.1–1.0)
NEUTROS ABS: 4.1 10*3/uL (ref 1.4–7.7)
Neutrophils Relative %: 52.3 % (ref 43.0–77.0)
Platelets: 280 10*3/uL (ref 150.0–400.0)
RBC: 4.2 Mil/uL (ref 3.87–5.11)
RDW: 15.3 % — AB (ref 11.5–14.6)
WBC: 7.8 10*3/uL (ref 4.5–10.5)

## 2014-01-07 LAB — LIPID PANEL
CHOLESTEROL: 201 mg/dL — AB (ref 0–200)
HDL: 45.5 mg/dL (ref >39.00)
LDL Cholesterol: 125 mg/dL — ABNORMAL HIGH (ref 0–99)
Total CHOL/HDL Ratio: 4
Triglycerides: 152 mg/dL — ABNORMAL HIGH (ref 0.0–149.0)
VLDL: 30.4 mg/dL (ref 0.0–40.0)

## 2014-01-07 LAB — POCT URINALYSIS DIPSTICK
Bilirubin, UA: NEGATIVE
Blood, UA: NEGATIVE
Glucose, UA: NEGATIVE
KETONES UA: NEGATIVE
Leukocytes, UA: NEGATIVE
Nitrite, UA: NEGATIVE
PH UA: 5.5
PROTEIN UA: NEGATIVE
SPEC GRAV UA: 1.015
Urobilinogen, UA: 0.2

## 2014-01-07 NOTE — Patient Instructions (Signed)
Osteoarthritis Osteoarthritis is a disease that causes soreness and swelling (inflammation) of a joint. It occurs when the cartilage at the affected joint wears down. Cartilage acts as a cushion, covering the ends of bones where they meet to form a joint. Osteoarthritis is the most common form of arthritis. It often occurs in older people. The joints affected most often by this condition include those in the:  Ends of the fingers.  Thumbs.  Neck.  Lower back.  Knees.  Hips. CAUSES  Over time, the cartilage that covers the ends of bones begins to wear away. This causes bone to rub on bone, producing pain and stiffness in the affected joints.  RISK FACTORS Certain factors can increase your chances of having osteoarthritis, including:  Older age.  Excessive body weight.  Overuse of joints. SIGNS AND SYMPTOMS   Pain, swelling, and stiffness in the joint.  Over time, the joint may lose its normal shape.  Small deposits of bone (osteophytes) may grow on the edges of the joint.  Bits of bone or cartilage can break off and float inside the joint space. This may cause more pain and damage. DIAGNOSIS  Your health care provider will do a physical exam and ask about your symptoms. Various tests may be ordered, such as:  X-rays of the affected joint.  An MRI scan.  Blood tests to rule out other types of arthritis.  Joint fluid tests. This involves using a needle to draw fluid from the joint and examining the fluid under a microscope. TREATMENT  Goals of treatment are to control pain and improve joint function. Treatment plans may include:  A prescribed exercise program that allows for rest and joint relief.  A weight control plan.  Pain relief techniques, such as:  Properly applied heat and cold.  Electric pulses delivered to nerve endings under the skin (transcutaneous electrical nerve stimulation, TENS).  Massage.  Certain nutritional supplements.  Medicines to  control pain, such as:  Acetaminophen.  Nonsteroidal anti-inflammatory drugs (NSAIDs), such as naproxen.  Narcotic or central-acting agents, such as tramadol.  Corticosteroids. These can be given orally or as an injection.  Surgery to reposition the bones and relieve pain (osteotomy) or to remove loose pieces of bone and cartilage. Joint replacement may be needed in advanced states of osteoarthritis. HOME CARE INSTRUCTIONS   Only take over-the-counter or prescription medicines as directed by your health care provider. Take all medicines exactly as instructed.  Maintain a healthy weight. Follow your health care provider's instructions for weight control. This may include dietary instructions.  Exercise as directed. Your health care provider can recommend specific types of exercise. These may include:  Strengthening exercises These are done to strengthen the muscles that support joints affected by arthritis. They can be performed with weights or with exercise bands to add resistance.  Aerobic activities These are exercises, such as brisk walking or low-impact aerobics, that get your heart pumping.  Range-of-motion activities These keep your joints limber.  Balance and agility exercises These help you maintain daily living skills.  Rest your affected joints as directed by your health care provider.  Follow up with your health care provider as directed. SEEK MEDICAL CARE IF:   Your skin turns red.  You develop a rash in addition to your joint pain.  You have worsening joint pain. SEEK IMMEDIATE MEDICAL CARE IF:  You have a significant loss of weight or appetite.  You have a fever along with joint or muscle aches.  You have   night sweats. FOR MORE INFORMATION  National Institute of Arthritis and Musculoskeletal and Skin Diseases: www.niams.nih.gov National Institute on Aging: www.nia.nih.gov American College of Rheumatology: www.rheumatology.org Document Released: 10/24/2005  Document Revised: 08/14/2013 Document Reviewed: 07/01/2013 Exit[employer] Patient Information 2014 [employer].  

## 2014-01-07 NOTE — Progress Notes (Signed)
Pre visit review using our clinic review tool, if applicable. No additional management support is needed unless otherwise documented below in the visit note. 

## 2014-01-07 NOTE — Progress Notes (Signed)
Subjective:    Patient ID: Carla Nash, female    DOB: 31-Jul-1946, 68 y.o.   MRN: [id number]  HPI 68 year old AAF, nonsmoker, is in for a Welcome to Medicare wellness  examination for this patient . I reviewed all health maintenance protocols including mammography, colonoscopy, bone density Needed referrals were placed. Age and diagnosis  appropriate screening labs were ordered. Her immunization history was reviewed and appropriate vaccinations were ordered. Her current medications and allergies were reviewed and needed refills of her chronic medications were ordered. The plan for yearly health maintenance was discussed all orders and referrals were made as appropriate.  She sees GYN for mammogram and pelvic exams and UTD. Colonoscopy UTD.    Review of Systems  Constitutional: Negative.   HENT: Negative.   Eyes: Negative.   Respiratory: Negative.   Cardiovascular: Negative.   Gastrointestinal: Negative.   Endocrine: Negative.   Genitourinary: Negative.   Musculoskeletal: Positive for arthralgias and back pain. Negative for myalgias and neck pain.  Skin: Negative.   Allergic/Immunologic: Negative.   Neurological: Negative.   Hematological: Negative.   Psychiatric/Behavioral: Negative.    Past Medical History  Diagnosis Date  . Asthma   . Obesity   . Diverticulosis   . Arthritis   . Borderline diabetes   . Glaucoma   . History of DVT of lower extremity 1990s    History   Social History  . Marital Status: Divorced    Spouse Name: N/A    Number of Children: 0  . Years of Education: N/A   Occupational History  . Retired    Social History Main Topics  . Smoking status: Never Smoker   . Smokeless tobacco: Never Used  . Alcohol Use: No  . Drug Use: No  . Sexual Activity: Not on file   Other Topics Concern  . Not on file   Social History Narrative   Regular exercise: no   Caffeine Use:  1-2 drinks daily                Past Surgical History  Procedure  Laterality Date  . Oophorectomy    . Supranumerary nipple excision      right breast  . Abdominal hysterectomy  2006  . Breast biopsy 2008      Family History  Problem Relation Age of Onset  . Heart disease Mother     CHF  . Arthritis Mother   . Diabetes Mother   . COPD Father   . Cancer Father     lung  . Kidney disease Sister     kidney failure    Allergies  Allergen Reactions  . Penicillins     REACTION: rash, itching    Current Outpatient Prescriptions on File Prior to Visit  Medication Sig Dispense Refill  . acetaminophen (TYLENOL) 500 MG tablet Take 1 tablet (500 mg total) by mouth 3 (three) times daily.  90 tablet  prn  . Multiple Vitamin (MULTIVITAMIN) tablet Take 1 tablet by mouth daily.         No current facility-administered medications on file prior to visit.    BP 142/88  Pulse 93  Ht 4\' 11"  (1.499 m)  Wt 296 lb 6.4 oz (134.446 kg)  BMI 59.83 kg/m2  SpO2 93%chart     Objective:   Physical Exam  Constitutional: She is oriented to person, place, and time. She appears well-developed and well-nourished.  HENT:  Head: Normocephalic and atraumatic.  Right Ear: External ear normal.  Left Ear: External ear normal.  Nose: Nose normal.  Mouth/Throat: Oropharynx is clear and moist.  Eyes: Conjunctivae and EOM are normal. Pupils are equal, round, and reactive to light.  Neck: Normal range of motion. Neck supple. No thyromegaly present.  Cardiovascular: Normal rate, regular rhythm and normal heart sounds.   Pulmonary/Chest: Effort normal and breath sounds normal.  Abdominal: Soft. Bowel sounds are normal.  Musculoskeletal: Normal range of motion. She exhibits no edema and no tenderness.  Neurological: She is alert and oriented to person, place, and time. She has normal reflexes. No cranial nerve deficit. Coordination normal.  Skin: Skin is warm and dry.  Psychiatric: She has a normal mood and affect.          Assessment & Plan:  Carla Nash was seen  today for annual exam.  Diagnoses and associated orders for this visit:  Welcome to Medicare preventive visit - Basic Metabolic Panel - Hepatic Function Panel - CBC with Differential - Lipid Panel - POC Urinalysis Dipstick - EKG 12-Lead  Morbid obesity - Basic Metabolic Panel - Hepatic Function Panel - CBC with Differential - Lipid Panel - POC Urinalysis Dipstick  Osteoarthritis - Basic Metabolic Panel - Hepatic Function Panel - CBC with Differential - Lipid Panel - POC Urinalysis Dipstick  Need for prophylactic vaccination with combined diphtheria-tetanus-pertussis (DTP) vaccine - Tdap vaccine greater than or equal to 7yo IM   Call caveats as with any questions or concerns in call the office with any questions or concerns. Recheck as scheduled, and as needed.

## 2014-02-10 ENCOUNTER — Ambulatory Visit (INDEPENDENT_AMBULATORY_CARE_PROVIDER_SITE_OTHER): Payer: Medicare Other | Admitting: Family

## 2014-02-10 ENCOUNTER — Encounter: Payer: Self-pay | Admitting: Family

## 2014-02-10 VITALS — BP 140/82 | HR 90 | Ht 59.0 in | Wt 300.0 lb

## 2014-02-10 DIAGNOSIS — M25519 Pain in unspecified shoulder: Secondary | ICD-10-CM | POA: Diagnosis not present

## 2014-02-10 DIAGNOSIS — M19019 Primary osteoarthritis, unspecified shoulder: Secondary | ICD-10-CM | POA: Diagnosis not present

## 2014-02-10 DIAGNOSIS — M19011 Primary osteoarthritis, right shoulder: Secondary | ICD-10-CM

## 2014-02-10 MED ORDER — MELOXICAM 15 MG PO TABS: 15.0000 mg | ORAL_TABLET | Freq: Every day | ORAL | Status: DC

## 2014-02-10 MED ORDER — PREDNISONE 20 MG PO TABS: ORAL_TABLET | ORAL | Status: AC

## 2014-02-10 NOTE — Progress Notes (Signed)
Subjective:    Patient ID: Carla Nash, female    DOB: [birth year], 68 y.o.   MRN: [id number]  HPI  68 y.o. Black female presents today with chief complaint of "right shoulder pain". Pt states that she has had "shoulder pain for years, but it has gotten worse over the last couple weeks". Pt rates that pain as a 10 on a 0-10 scale; states that the pain is constant; it is an aching pain; the pain radiates to her wrist; she has taken IBuprofen to help with the pain but it brings minimal relief; moving the shoulder makes the pain worse. Pt states that she has had steroid joint injections to her knees in the "late 90's" and they were helpful. Pt denies fever, fatigue, malaise and change in appetite.     Review of Systems  Constitutional: Positive for activity change.       Decreased activity due to right shoulder pain.   HENT: Negative.   Eyes: Negative.   Respiratory: Negative.   Cardiovascular: Negative.   Gastrointestinal: Negative.   Endocrine: Negative.   Genitourinary: Negative.   Musculoskeletal: Positive for arthralgias.       Arthralgias to bilateral knees and right shoulder.   Skin: Negative.   Allergic/Immunologic: Negative.   Neurological: Negative.   Hematological: Negative.   Psychiatric/Behavioral: Negative.    Past Medical History  Diagnosis Date  . Asthma   . Obesity   . Diverticulosis   . Arthritis   . Borderline diabetes   . Glaucoma   . History of DVT of lower extremity 1990s    History   Social History  . Marital Status: Divorced    Spouse Name: N/A    Number of Children: 0  . Years of Education: N/A   Occupational History  . Retired    Social History Main Topics  . Smoking status: Never Smoker   . Smokeless tobacco: Never Used  . Alcohol Use: No  . Drug Use: No  . Sexual Activity: Not on file   Other Topics Concern  . Not on file   Social History Narrative   Regular exercise: no   Caffeine Use:  1-2 drinks daily                 Past Surgical History  Procedure Laterality Date  . Oophorectomy    . Supranumerary nipple excision      right breast  . Abdominal hysterectomy  2006  . Breast biopsy 2008      Family History  Problem Relation Age of Onset  . Heart disease Mother     CHF  . Arthritis Mother   . Diabetes Mother   . COPD Father   . Cancer Father     lung  . Kidney disease Sister     kidney failure    Allergies  Allergen Reactions  . Penicillins     REACTION: rash, itching    Current Outpatient Prescriptions on File Prior to Visit  Medication Sig Dispense Refill  . acetaminophen (TYLENOL) 500 MG tablet Take 1 tablet (500 mg total) by mouth 3 (three) times daily.  90 tablet  prn  . Multiple Vitamin (MULTIVITAMIN) tablet Take 1 tablet by mouth daily.         No current facility-administered medications on file prior to visit.    BP 140/82  Pulse 90  Ht 4\' 11"  (1.499 m)  Wt 300 lb (136.079 kg)  BMI 60.56 kg/m2  SpO2 94%chart  Objective:   Physical Exam  Constitutional: She is oriented to person, place, and time. She appears well-developed and well-nourished. She is active.  Cardiovascular: Normal rate, regular rhythm and normal heart sounds.   Pulmonary/Chest: Effort normal and breath sounds normal.  Abdominal: Soft. Normal appearance and bowel sounds are normal.  Musculoskeletal:       Right shoulder: She exhibits decreased range of motion and tenderness.  Neurological: She is alert and oriented to person, place, and time.  Skin: Skin is warm, dry and intact.  Psychiatric: She has a normal mood and affect. Her speech is normal and behavior is normal. Thought content normal.          Assessment & Plan:  68 y.o. Black female presents with chief complaint of right shoulder pain.  - Right Shoulder pain/ Osteoarthritis   - Xray right shoulder: have not seen films of shoulder in three years, want to rule out other causes of pain.   - Prednisone Taper: 60mg  x 3 days, 40mg  x  3 days, 20mg  x 3 days  - Mobic 15mg  po Qday AFTER completion of steroid.  - Education: Apply ice to shoulder to help relieve pain, use active range of motion. Exercise 30 min a day Qday.  - Follow up: pending shoulder xray.

## 2014-02-10 NOTE — Patient Instructions (Signed)
Osteoarthritis Osteoarthritis is a disease that causes soreness and swelling (inflammation) of a joint. It occurs when the cartilage at the affected joint wears down. Cartilage acts as a cushion, covering the ends of bones where they meet to form a joint. Osteoarthritis is the most common form of arthritis. It often occurs in older people. The joints affected most often by this condition include those in the:  Ends of the fingers.  Thumbs.  Neck.  Lower back.  Knees.  Hips. CAUSES  Over time, the cartilage that covers the ends of bones begins to wear away. This causes bone to rub on bone, producing pain and stiffness in the affected joints.  RISK FACTORS Certain factors can increase your chances of having osteoarthritis, including:  Older age.  Excessive body weight.  Overuse of joints. SIGNS AND SYMPTOMS   Pain, swelling, and stiffness in the joint.  Over time, the joint may lose its normal shape.  Small deposits of bone (osteophytes) may grow on the edges of the joint.  Bits of bone or cartilage can break off and float inside the joint space. This may cause more pain and damage. DIAGNOSIS  Your health care provider will do a physical exam and ask about your symptoms. Various tests may be ordered, such as:  X-rays of the affected joint.  An MRI scan.  Blood tests to rule out other types of arthritis.  Joint fluid tests. This involves using a needle to draw fluid from the joint and examining the fluid under a microscope. TREATMENT  Goals of treatment are to control pain and improve joint function. Treatment plans may include:  A prescribed exercise program that allows for rest and joint relief.  A weight control plan.  Pain relief techniques, such as:  Properly applied heat and cold.  Electric pulses delivered to nerve endings under the skin (transcutaneous electrical nerve stimulation, TENS).  Massage.  Certain nutritional supplements.  Medicines to  control pain, such as:  Acetaminophen.  Nonsteroidal anti-inflammatory drugs (NSAIDs), such as naproxen.  Narcotic or central-acting agents, such as tramadol.  Corticosteroids. These can be given orally or as an injection.  Surgery to reposition the bones and relieve pain (osteotomy) or to remove loose pieces of bone and cartilage. Joint replacement may be needed in advanced states of osteoarthritis. HOME CARE INSTRUCTIONS   Only take over-the-counter or prescription medicines as directed by your health care provider. Take all medicines exactly as instructed.  Maintain a healthy weight. Follow your health care provider's instructions for weight control. This may include dietary instructions.  Exercise as directed. Your health care provider can recommend specific types of exercise. These may include:  Strengthening exercises These are done to strengthen the muscles that support joints affected by arthritis. They can be performed with weights or with exercise bands to add resistance.  Aerobic activities These are exercises, such as brisk walking or low-impact aerobics, that get your heart pumping.  Range-of-motion activities These keep your joints limber.  Balance and agility exercises These help you maintain daily living skills.  Rest your affected joints as directed by your health care provider.  Follow up with your health care provider as directed. SEEK MEDICAL CARE IF:   Your skin turns red.  You develop a rash in addition to your joint pain.  You have worsening joint pain. SEEK IMMEDIATE MEDICAL CARE IF:  You have a significant loss of weight or appetite.  You have a fever along with joint or muscle aches.  You have   night sweats. FOR MORE INFORMATION  National Institute of Arthritis and Musculoskeletal and Skin Diseases: www.niams.nih.gov National Institute on Aging: www.nia.nih.gov American College of Rheumatology: www.rheumatology.org Document Released: 10/24/2005  Document Revised: 08/14/2013 Document Reviewed: 07/01/2013 Exit[employer] Patient Information 2014 [employer].  

## 2014-02-10 NOTE — Progress Notes (Signed)
Pre visit review using our clinic review tool, if applicable. No additional management support is needed unless otherwise documented below in the visit note. 

## 2014-02-11 ENCOUNTER — Ambulatory Visit (INDEPENDENT_AMBULATORY_CARE_PROVIDER_SITE_OTHER)
Admission: RE | Admit: 2014-02-11 | Discharge: 2014-02-11 | Disposition: A | Discharge status: Home / Self Care | Payer: Medicare Other | Source: Ambulatory Visit | Attending: Family | Admitting: Family

## 2014-02-11 DIAGNOSIS — M25519 Pain in unspecified shoulder: Secondary | ICD-10-CM | POA: Diagnosis not present

## 2014-02-11 DIAGNOSIS — M19019 Primary osteoarthritis, unspecified shoulder: Secondary | ICD-10-CM | POA: Diagnosis not present

## 2014-02-11 DIAGNOSIS — M19011 Primary osteoarthritis, right shoulder: Secondary | ICD-10-CM

## 2014-02-20 ENCOUNTER — Telehealth: Payer: Self-pay | Admitting: Internal Medicine

## 2014-02-20 NOTE — Telephone Encounter (Signed)
Pt called to req rx on meloxicam (MOBIC) 15 MG tablet and prednisone she said they both work well for her

## 2014-02-20 NOTE — Telephone Encounter (Signed)
Called and spoke to the pt.  She states her pain is better but is still bothersome.  Would like refills of Mobic and prednisone.  Pt aware Oran Reinadonda is out of the office on 02/20/14 and will return on 02/21/14.

## 2014-02-21 MED ORDER — MELOXICAM 15 MG PO TABS: 15.0000 mg | ORAL_TABLET | Freq: Every day | ORAL | Status: DC

## 2014-02-21 NOTE — Telephone Encounter (Signed)
Take mobic daily. I will add prednisone if necessary.

## 2014-02-21 NOTE — Telephone Encounter (Signed)
Pt aware.

## 2014-04-21 ENCOUNTER — Telehealth: Payer: Self-pay | Admitting: Internal Medicine

## 2014-04-21 DIAGNOSIS — M25519 Pain in unspecified shoulder: Secondary | ICD-10-CM

## 2014-04-21 NOTE — Telephone Encounter (Signed)
Pt saw Np Campbell  Pt called to say that meloxicam (MOBIC) 15 MG tablet is not helping with her pain she said she is in pain morning, noon and night. Pt asked if there is something else you can give her for pain .

## 2014-04-21 NOTE — Telephone Encounter (Signed)
Pt aware and appt has been scheduled

## 2014-04-21 NOTE — Telephone Encounter (Signed)
Arthritis noted per xray. I am referring to orthopedics.

## 2014-04-21 NOTE — Telephone Encounter (Signed)
Please advise 

## 2014-04-24 DIAGNOSIS — M19019 Primary osteoarthritis, unspecified shoulder: Secondary | ICD-10-CM | POA: Diagnosis not present

## 2014-05-20 ENCOUNTER — Telehealth: Payer: Self-pay | Admitting: Internal Medicine

## 2014-05-20 MED ORDER — MELOXICAM 15 MG PO TABS: 15.0000 mg | ORAL_TABLET | Freq: Every day | ORAL | Status: DC

## 2014-05-20 NOTE — Telephone Encounter (Signed)
rx sent in electronically 

## 2014-05-20 NOTE — Telephone Encounter (Signed)
[id number] - [place], [place] - [address] is requesting re-fill on meloxicam (MOBIC) 15 MG tablet ° °

## 2014-06-23 DIAGNOSIS — M79609 Pain in unspecified limb: Secondary | ICD-10-CM | POA: Diagnosis not present

## 2015-07-21 ENCOUNTER — Telehealth: Payer: Self-pay | Admitting: Adult Health

## 2015-07-21 ENCOUNTER — Encounter: Payer: Self-pay | Admitting: Adult Health

## 2015-07-21 ENCOUNTER — Ambulatory Visit (INDEPENDENT_AMBULATORY_CARE_PROVIDER_SITE_OTHER): Payer: Medicare Other | Admitting: Adult Health

## 2015-07-21 VITALS — BP 118/80 | Temp 98.6°F | Ht 59.0 in | Wt 283.1 lb

## 2015-07-21 DIAGNOSIS — H9193 Unspecified hearing loss, bilateral: Secondary | ICD-10-CM | POA: Diagnosis not present

## 2015-07-21 DIAGNOSIS — Z23 Encounter for immunization: Secondary | ICD-10-CM | POA: Diagnosis not present

## 2015-07-21 DIAGNOSIS — Z780 Asymptomatic menopausal state: Secondary | ICD-10-CM

## 2015-07-21 DIAGNOSIS — Z7189 Other specified counseling: Secondary | ICD-10-CM | POA: Diagnosis not present

## 2015-07-21 DIAGNOSIS — Z7689 Persons encountering health services in other specified circumstances: Secondary | ICD-10-CM

## 2015-07-21 MED ORDER — MELOXICAM 15 MG PO TABS: 15.0000 mg | ORAL_TABLET | Freq: Every day | ORAL | Status: DC

## 2015-07-21 MED ORDER — DOXYCYCLINE HYCLATE 100 MG PO CAPS: 100.0000 mg | ORAL_CAPSULE | Freq: Every day | ORAL | Status: DC

## 2015-07-21 NOTE — Patient Instructions (Signed)
It was great meeting you today! Welcome to the team!  Please follow up for a physical.   Take the Doxycycline one a day for your acne. Please stay out of the sun while taking this medication or wear sunscreen.   Someone will call you for a bone density and hearing exam.

## 2015-07-21 NOTE — Telephone Encounter (Signed)
Pt needs refill on meloxicam 15 mg #30 send to [facility]

## 2015-07-21 NOTE — Progress Notes (Signed)
HPI:  Carla Nash is here to establish care.  Last PCP and physical: 01/2014- with NP Hyman Hopes.   Has the following chronic problems that require follow up and concerns today:   Arthritis She endorses having arthritis in her knees and shoulder. She has been seen by orthopedics in the past. She had a steroid shot in the right shoulder that only gave her relief for 3 days.   Cystic Ance - She has had adult acne since 2005. She has not really tried any OTC medications for this.   ROS negative for unless reported above: fevers, chills,feeling poorly, unintentional weight loss, hearing or vision loss, chest pain, palpitations, leg claudication, struggling to breath,Not feeling congested in the chest, no orthopenia, no cough,no wheezing, normal appetite, no soft tissue swelling, no hemoptysis, melena, hematochezia, hematuria, falls, loc, si, or thoughts of self harm.  Immunizations:UTD Diet: Is working on diet.  Exercise: Has not been exercising Colonoscopy:2010 Dexa: Unsure Pap Smear:Sept 30th, 2016 Mammogram: Sept 30, 2016 Dentist: Does not go  Eye: Yearly - for glaucoma   Not followed by any other specialties.   Past Medical History  Diagnosis Date  . Asthma   . Obesity   . Diverticulosis   . Arthritis   . Borderline diabetes   . Glaucoma   . History of DVT of lower extremity 1990s    Past Surgical History  Procedure Laterality Date  . Oophorectomy    . Supranumerary nipple excision      right breast  . Abdominal hysterectomy  2006  . Breast biopsy 2008      Family History  Problem Relation Age of Onset  . Heart disease Mother     CHF  . Arthritis Mother   . Diabetes Mother   . COPD Father   . Cancer Father     lung  . Kidney disease Sister     kidney failure    Social History   Social History  . Marital Status: Divorced    Spouse Name: N/A  . Number of Children: 0  . Years of Education: N/A   Occupational History  . Retired    Social History Main  Topics  . Smoking status: Never Smoker   . Smokeless tobacco: Never Used  . Alcohol Use: No  . Drug Use: No  . Sexual Activity: Not Asked   Other Topics Concern  . None   Social History Narrative   Regular exercise: no   Caffeine Use:  1-2 drinks daily                 Current outpatient prescriptions:  .  acetaminophen (TYLENOL) 500 MG tablet, Take 1 tablet (500 mg total) by mouth 3 (three) times daily., Disp: 90 tablet, Rfl: prn .  meloxicam (MOBIC) 15 MG tablet, Take 1 tablet (15 mg total) by mouth daily. (Patient not taking: Reported on 07/21/2015), Disp: 30 tablet, Rfl: 4 .  Multiple Vitamin (MULTIVITAMIN) tablet, Take 1 tablet by mouth daily.  , Disp: , Rfl:   EXAM:  Filed Vitals:   07/21/15 0820  BP: 118/80  Temp: 98.6 F (37 C)    Body mass index is 57.15 kg/(m^2).  GENERAL: vitals reviewed and listed above, alert, oriented, appears well hydrated and in no acute distress. Obese.   HEENT: atraumatic, conjunttiva clear, no obvious abnormalities on inspection of external nose and ears. TM's visualized, no cerumen impaction  NECK: Neck is soft and supple without masses, no adenopathy or thyromegaly, trachea midline,  no JVD. Normal range of motion.   LUNGS: clear to auscultation bilaterally, no wheezes, rales or rhonchi, good air movement  CV: Regular rate and rhythm, normal S1/S2, no audible murmurs, gallops, or rubs. No carotid bruit and no peripheral edema.   MS: moves all extremities without noticeable abnormality. No edema noted. Walks with a cane  Abd: soft/nontender/nondistended/normal bowel sounds   Skin: warm and dry, no rash   Extremities: No clubbing, cyanosis, or edema. Capillary refill is WNL. Pulses intact bilaterally in upper and lower extremities.   Neuro: CN II-XII intact, sensation and reflexes normal throughout, 5/5 muscle strength in bilateral upper and lower extremities. Normal finger to nose. Normal rapid alternating movements. Normal  romberg. No pronator drift.   PSYCH: pleasant and cooperative, no obvious depression or anxiety  ASSESSMENT AND PLAN:   1. Encounter to establish care - Follow up for CPE - Follow up sooner if needed - Continue to work on diet and exercise as much as possible.   2. Postmenopausal - DG Bone Density; Future  3. Hearing difficulty of both ears  - Ambulatory referral to Audiology  4. Need for pneumococcal vaccine - Pneumococcal conjugate vaccine 13-valent IM  5. Encounter for immunization - High dose flu vaccination  -We reviewed the PMH, PSH, FH, SH, Meds and Allergies. -We provided refills for any medications we will prescribe as needed. -We addressed current concerns per orders and patient instructions. -We have asked for records for pertinent exams, studies, vaccines and notes from previous providers. -We have advised patient to follow up per instructions below.   -Patient advised to return or notify a provider immediately if symptoms worsen or persist or new concerns arise.    Shirline Frees, AGNP

## 2015-07-21 NOTE — Telephone Encounter (Signed)
Ok per [facility] to send rx to pharmacy.  Rx sent.

## 2015-08-18 DIAGNOSIS — H903 Sensorineural hearing loss, bilateral: Secondary | ICD-10-CM | POA: Diagnosis not present

## 2015-09-10 ENCOUNTER — Ambulatory Visit (INDEPENDENT_AMBULATORY_CARE_PROVIDER_SITE_OTHER): Payer: Medicare Other | Admitting: Adult Health

## 2015-09-10 ENCOUNTER — Encounter: Payer: Self-pay | Admitting: Adult Health

## 2015-09-10 ENCOUNTER — Telehealth: Payer: Self-pay | Admitting: Adult Health

## 2015-09-10 VITALS — BP 118/80 | Temp 98.7°F | Ht 59.0 in | Wt 278.0 lb

## 2015-09-10 DIAGNOSIS — R7309 Other abnormal glucose: Secondary | ICD-10-CM | POA: Diagnosis not present

## 2015-09-10 DIAGNOSIS — Z0000 Encounter for general adult medical examination without abnormal findings: Secondary | ICD-10-CM

## 2015-09-10 DIAGNOSIS — E785 Hyperlipidemia, unspecified: Secondary | ICD-10-CM

## 2015-09-10 DIAGNOSIS — R739 Hyperglycemia, unspecified: Secondary | ICD-10-CM

## 2015-09-10 DIAGNOSIS — E669 Obesity, unspecified: Secondary | ICD-10-CM

## 2015-09-10 LAB — CBC WITH DIFFERENTIAL/PLATELET
BASOS PCT: 0.9 % (ref 0.0–3.0)
Basophils Absolute: 0.1 10*3/uL (ref 0.0–0.1)
EOS PCT: 3.3 % (ref 0.0–5.0)
Eosinophils Absolute: 0.2 10*3/uL (ref 0.0–0.7)
HEMATOCRIT: 38.1 % (ref 36.0–46.0)
HEMOGLOBIN: 12.4 g/dL (ref 12.0–15.0)
Lymphocytes Relative: 45 % (ref 12.0–46.0)
Lymphs Abs: 3.3 10*3/uL (ref 0.7–4.0)
MCHC: 32.6 g/dL (ref 30.0–36.0)
MCV: 91 fl (ref 78.0–100.0)
MONOS PCT: 5.9 % (ref 3.0–12.0)
Monocytes Absolute: 0.4 10*3/uL (ref 0.1–1.0)
Neutro Abs: 3.3 10*3/uL (ref 1.4–7.7)
Neutrophils Relative %: 44.9 % (ref 43.0–77.0)
Platelets: 280 10*3/uL (ref 150.0–400.0)
RBC: 4.19 Mil/uL (ref 3.87–5.11)
RDW: 15 % (ref 11.5–15.5)
WBC: 7.3 10*3/uL (ref 4.0–10.5)

## 2015-09-10 LAB — LIPID PANEL
Cholesterol: 204 mg/dL — ABNORMAL HIGH (ref 0–200)
HDL: 52.9 mg/dL (ref >39.00)
LDL Cholesterol: 122 mg/dL — ABNORMAL HIGH (ref 0–99)
NonHDL: 151.57
Total CHOL/HDL Ratio: 4
Triglycerides: 149 mg/dL (ref 0.0–149.0)
VLDL: 29.8 mg/dL (ref 0.0–40.0)

## 2015-09-10 LAB — HEPATIC FUNCTION PANEL
ALK PHOS: 90 U/L (ref 39–117)
ALT: 13 U/L (ref 0–35)
AST: 15 U/L (ref 0–37)
Albumin: 4.5 g/dL (ref 3.5–5.2)
BILIRUBIN DIRECT: 0.1 mg/dL (ref 0.0–0.3)
Total Bilirubin: 0.4 mg/dL (ref 0.2–1.2)
Total Protein: 8.8 g/dL — ABNORMAL HIGH (ref 6.0–8.3)

## 2015-09-10 LAB — BASIC METABOLIC PANEL
BUN: 11 mg/dL (ref 6–23)
CHLORIDE: 106 meq/L (ref 96–112)
CO2: 25 mEq/L (ref 19–32)
Calcium: 10.5 mg/dL (ref 8.4–10.5)
Creatinine, Ser: 0.73 mg/dL (ref 0.40–1.20)
GFR: 102.3 mL/min (ref >60.00)
GLUCOSE: 92 mg/dL (ref 70–99)
POTASSIUM: 4.2 meq/L (ref 3.5–5.1)
SODIUM: 140 meq/L (ref 135–145)

## 2015-09-10 LAB — POCT URINALYSIS DIPSTICK
Bilirubin, UA: NEGATIVE
GLUCOSE UA: NEGATIVE
Ketones, UA: NEGATIVE
Leukocytes, UA: NEGATIVE
NITRITE UA: NEGATIVE
Spec Grav, UA: 1.015
UROBILINOGEN UA: 0.2
pH, UA: 5.5

## 2015-09-10 LAB — HEMOGLOBIN A1C: HEMOGLOBIN A1C: 5 % (ref 4.6–6.5)

## 2015-09-10 LAB — TSH: TSH: 1.29 u[IU]/mL (ref 0.35–4.50)

## 2015-09-10 NOTE — Progress Notes (Addendum)
Subjective:  Patient presents today for their annual wellness visit.    Preventive Screening-Counseling & Management  Smoking Status: Never Smoker Second Hand Smoking status: No smokers in home  Risk Factors Regular exercise: As much as she she can Diet: Tries to eat healthy Fall Risk: None   Cardiac risk factors:  advanced age (older than 56 for men, 45 for women)  Hyperlipidemia  No diabetes.  Family History: Heart disease and diabetes with mom  Obesity  Depression Screen None. PHQ2 0   Activities of Daily Living Independent ADLs and IADLs   Hearing Difficulties:-patient declines- went to have her hearing checked and states " it is a scam"  Cognitive Testing No reported trouble.   Normal 3 word recall  List the Names of Other Physician/Practitioners you currently use: 1. Dr. Henderson Cloud - GYN 2. [facility] - has an eye exam coming up in the spring of 2017  Immunization History  Administered Date(s) Administered  . Influenza Split 10/20/2011  . Influenza Whole 09/07/2010  . Influenza, High Dose Seasonal PF 07/21/2015  . Pneumococcal Conjugate-13 07/21/2015  . Pneumococcal Polysaccharide-23 03/16/2012  . Td 12/09/2008  . Tdap 01/07/2014  . Zoster 03/16/2012   Required Immunizations needed today: None  Screening tests- up to date Health Maintenance Due  Topic Date Due  . Hepatitis C Screening  07/15/1946  . DEXA SCAN  10/21/2013    ROS- No pertinent positives discovered in course of AWV  The following were reviewed and entered/updated in epic: Past Medical History  Diagnosis Date  . Asthma   . Obesity   . Diverticulosis   . Arthritis   . Borderline diabetes   . Glaucoma   . History of DVT of lower extremity 1990s   Patient Active Problem List   Diagnosis Date Noted  . Elevated BP 03/16/2012  . Elevated lipids 03/16/2012  . Osteoarthritis 03/09/2011  . CYSTIC ACNE 07/10/2009  . OBESITY 12/09/2008  . ASTHMA 05/28/2007   Past  Surgical History  Procedure Laterality Date  . Oophorectomy    . Supranumerary nipple excision      right breast  . Abdominal hysterectomy  2006  . Breast biopsy 2008      Family History  Problem Relation Age of Onset  . Heart disease Mother     CHF  . Arthritis Mother   . Diabetes Mother   . COPD Father   . Cancer Father     lung  . Kidney disease Sister     kidney failure    Medications- reviewed and updated Current Outpatient Prescriptions  Medication Sig Dispense Refill  . acetaminophen (TYLENOL) 500 MG tablet Take 1 tablet (500 mg total) by mouth 3 (three) times daily. 90 tablet prn  . meloxicam (MOBIC) 15 MG tablet Take 1 tablet (15 mg total) by mouth daily. 30 tablet 5  . Multiple Vitamin (MULTIVITAMIN) tablet Take 1 tablet by mouth daily.       No current facility-administered medications for this visit.    Allergies-reviewed and updated Allergies  Allergen Reactions  . Penicillins     REACTION: rash, itching    Social History   Social History  . Marital Status: Divorced    Spouse Name: N/A  . Number of Children: 0  . Years of Education: N/A   Occupational History  . Retired    Social History Main Topics  . Smoking status: Never Smoker   . Smokeless tobacco: Never Used  . Alcohol Use: No  .  Drug Use: No  . Sexual Activity: Not Asked   Other Topics Concern  . None   Social History Narrative   Divorced for 16 years    No children    Lives with Niece    Regular exercise: no   Caffeine Use:  1-2 drinks daily                Objective: Temp(Src) 98.7 F (37.1 C) (Oral)  Ht 4\' 11"  (1.499 m)  Wt 287 lb 11.2 oz (130.5 kg)  BMI 58.08 kg/m2 GENERAL: vitals reviewed and listed above, alert, oriented, appears well hydrated and in no acute distress. Obese.   HEENT: atraumatic, conjunttiva clear, no obvious abnormalities on inspection of external nose and ears. TM's visualized, no cerumen impaction  NECK: Neck is soft and supple without  masses, no adenopathy or thyromegaly, trachea midline, no JVD. Normal range of motion.   LUNGS: clear to auscultation bilaterally, no wheezes, rales or rhonchi, good air movement  CV: Regular rate and rhythm, normal S1/S2, no audible murmurs, gallops, or rubs. No carotid bruit and no peripheral edema.   MS: moves all extremities without noticeable abnormality. No edema noted. Walks with a cane  Abd: soft/nontender/nondistended/normal bowel sounds   Breast Exam: Fibrous breast tissue. No masses felt, no dimpling or discharge. Has scar on right breast from previous biopsy  Skin: warm and dry, no rash   Extremities: No clubbing, cyanosis, or edema. Capillary refill is WNL. Pulses intact bilaterally in upper and lower extremities.   Neuro: CN II-XII intact, sensation and reflexes normal throughout, 5/5 muscle strength in bilateral upper and lower extremities. Normal finger to nose. Normal rapid alternating movements.   PSYCH: pleasant and cooperative, no obvious depression or anxiety  Assessment/Plan:  1. Encounter for Medicare annual wellness exam - Follow up in one year for MWE - Ideally I would like her to lose weight. She has understanding that she needs to.  - Information on advanced directives and living will given  2. OBESITY - Healthy eating, portion control,  - Basic metabolic panel - CBC with Differential/Platelet - Hepatic function panel - Lipid panel - TSH  3. Elevated lipids  - Basic metabolic panel - CBC with Differential/Platelet - Hemoglobin A1c - Hepatic function panel - Lipid panel - POCT urinalysis dipstick - TSH   5. Elevated blood sugar  - Basic metabolic panel - CBC with Differential/Platelet - Hemoglobin A1c - Hepatic function panel - Lipid panel - POCT urinalysis dipstick - TSH      These are the goals we discussed: Goals    . Cut out extra servings    . Increase physical activity       This is a list of the screening recommended  for you and due dates:  Health Maintenance  Topic Date Due  .  Hepatitis C: One time screening is recommended by Center for Disease Control  (CDC) for  adults born from [birth year] through 1963.   [birth year]  . DEXA scan (bone density measurement)  10/21/2013  . Mammogram  01/09/2016  . Flu Shot  06/07/2016  . Pneumonia vaccines (2 of 2 - PPSV23) 03/16/2017  . Colon Cancer Screening  12/09/2018  . Tetanus Vaccine  01/08/2024  . Shingles Vaccine  Completed    Pre visit review using our clinic review tool, if applicable. No additional management support is needed unless otherwise documented below in the visit note.

## 2015-09-10 NOTE — Telephone Encounter (Signed)
Changed and reprinted.

## 2015-09-10 NOTE — Patient Instructions (Signed)
It was great seeing you again!  I will follow up with you regarding your blood work.   Continue to work on diet and exercise.   Please let me know if you need anything.

## 2015-09-10 NOTE — Telephone Encounter (Signed)
Pt states her weight was 278.3 this am. hoever aAVS states 287. (130.5kg).  Pt would like to know if you can correct her weight to the 278.3.

## 2015-12-10 ENCOUNTER — Other Ambulatory Visit: Payer: Self-pay | Admitting: Adult Health

## 2015-12-10 NOTE — Telephone Encounter (Signed)
Ok to refill 

## 2016-05-12 ENCOUNTER — Other Ambulatory Visit: Payer: Self-pay

## 2016-05-12 DIAGNOSIS — Z1289 Encounter for screening for malignant neoplasm of other sites: Secondary | ICD-10-CM | POA: Diagnosis not present

## 2016-05-12 DIAGNOSIS — Z1231 Encounter for screening mammogram for malignant neoplasm of breast: Secondary | ICD-10-CM | POA: Diagnosis not present

## 2016-05-12 MED ORDER — MELOXICAM 15 MG PO TABS: ORAL_TABLET | ORAL | Status: DC

## 2016-05-12 NOTE — Telephone Encounter (Signed)
Ok to refill for 90 days with 1 additional refill

## 2016-05-12 NOTE — Telephone Encounter (Signed)
[facility] [address] is requesting a refill on Meloxicam 15mg  - ok to refill?

## 2016-06-06 ENCOUNTER — Telehealth: Payer: Self-pay | Admitting: Adult Health

## 2016-06-06 NOTE — Telephone Encounter (Signed)
Pt is calling to know if cory will give her cortisone injection in her thigh

## 2016-06-07 NOTE — Telephone Encounter (Signed)
Left message with patient's daughter to have patient call me back.

## 2016-06-07 NOTE — Telephone Encounter (Signed)
Patient notified and verbalized understanding Appt scheduled.   

## 2016-06-07 NOTE — Telephone Encounter (Signed)
She needs to be evaluated first

## 2016-06-10 ENCOUNTER — Encounter: Payer: Self-pay | Admitting: Adult Health

## 2016-06-10 ENCOUNTER — Ambulatory Visit (INDEPENDENT_AMBULATORY_CARE_PROVIDER_SITE_OTHER): Payer: Medicare Other | Admitting: Adult Health

## 2016-06-10 VITALS — BP 142/78 | Temp 98.6°F | Ht 59.0 in | Wt 298.6 lb

## 2016-06-10 DIAGNOSIS — M5432 Sciatica, left side: Secondary | ICD-10-CM | POA: Diagnosis not present

## 2016-06-10 DIAGNOSIS — L708 Other acne: Secondary | ICD-10-CM

## 2016-06-10 MED ORDER — CYCLOBENZAPRINE HCL 10 MG PO TABS: 10.0000 mg | ORAL_TABLET | Freq: Three times a day (TID) | ORAL | 0 refills | Status: DC | PRN

## 2016-06-10 MED ORDER — METHYLPREDNISOLONE ACETATE 80 MG/ML IJ SUSP
80.0000 mg | Freq: Once | INTRAMUSCULAR | Status: AC
  Administered 2016-06-10: 80 mg via INTRAMUSCULAR

## 2016-06-10 MED ORDER — DOXYCYCLINE HYCLATE 100 MG PO CAPS: 100.0000 mg | ORAL_CAPSULE | Freq: Every day | ORAL | 3 refills | Status: DC

## 2016-06-10 NOTE — Progress Notes (Signed)
Subjective:    Patient ID: Carla Nash, female    DOB: 1946/03/18, 70 y.o.   MRN: [id number]  HPI  70 year old female who presents to the clinic today with one and a half months of left sided leg pain. She reports that the pain radiates from her left buttocks down the outside of her left leg. The pain is described as " someone stomping on me."   She has been using OTC Nsaids without relief.   She denies any problems with incontinence or paraesthesia    She also needs a refill of Doxycyline for cystic acne   Review of Systems  Constitutional: Negative.   Respiratory: Negative.   Cardiovascular: Negative.   Musculoskeletal: Positive for arthralgias (chronic ), back pain (chronic ) and myalgias. Negative for joint swelling.  All other systems reviewed and are negative.  Past Medical History:  Diagnosis Date  . Arthritis   . Asthma   . Borderline diabetes   . Diverticulosis   . Glaucoma   . History of DVT of lower extremity 1990s  . Obesity     Social History   Social History  . Marital status: Divorced    Spouse name: N/A  . Number of children: 0  . Years of education: N/A   Occupational History  . Retired    Social History Main Topics  . Smoking status: Never Smoker  . Smokeless tobacco: Never Used  . Alcohol use No  . Drug use: No  . Sexual activity: Not on file   Other Topics Concern  . Not on file   Social History Narrative   Divorced for 16 years    No children    Lives with Niece    Regular exercise: no   Caffeine Use:  1-2 drinks daily                Past Surgical History:  Procedure Laterality Date  . ABDOMINAL HYSTERECTOMY  2006  . breast biopsy 2008    . OOPHORECTOMY    . SUPRANUMERARY NIPPLE EXCISION     right breast    Family History  Problem Relation Age of Onset  . Heart disease Mother     CHF  . Arthritis Mother   . Diabetes Mother   . COPD Father   . Cancer Father     lung  . Kidney disease Sister     kidney  failure    Allergies  Allergen Reactions  . Penicillins     REACTION: rash, itching    Current Outpatient Prescriptions on File Prior to Visit  Medication Sig Dispense Refill  . acetaminophen (TYLENOL) 500 MG tablet Take 1 tablet (500 mg total) by mouth 3 (three) times daily. 90 tablet prn  . meloxicam (MOBIC) 15 MG tablet TAKE 1 TABLET (15 MG TOTAL) BY MOUTH DAILY. 90 tablet 1  . Multiple Vitamin (MULTIVITAMIN) tablet Take 1 tablet by mouth daily.       No current facility-administered medications on file prior to visit.     BP (!) 142/78   Temp 98.6 F (37 C) (Oral)   Ht  (1.499 m)   Wt 298 lb 9.6 oz (135.4 kg)   BMI 60.31 kg/m       Objective:   Physical Exam  Constitutional: She is oriented to person, place, and time. She appears well-developed and well-nourished. No distress.  Cardiovascular: Normal rate, regular rhythm, normal heart sounds and intact distal pulses.  Exam reveals no gallop  and no friction rub.   No murmur heard. Musculoskeletal: She exhibits tenderness. She exhibits no edema or deformity.  Slight tenderness with palpation to left lateral thigh. Unable to perform ROM due to body habitus   Neurological: She is alert and oriented to person, place, and time.  Skin: Skin is warm and dry. No rash noted. She is not diaphoretic. No erythema. No pallor.  Psychiatric: She has a normal mood and affect. Her behavior is normal. Judgment and thought content normal.  Nursing note and vitals reviewed.     Assessment & Plan:  1. Sciatica of left side - No concern for DVT. Exam and complaint consistent with sciatica  - cyclobenzaprine (FLEXERIL) 10 MG tablet; Take 1 tablet (10 mg total) by mouth 3 (three) times daily as needed for muscle spasms.  Dispense: 30 tablet; Refill: 0 - methylPREDNISolone acetate (DEPO-MEDROL) injection 80 mg; Inject 1 mL (80 mg total) into the muscle once. - Can take Motrin 600mg  Q8H prn  - Heating pad - Follow up if no  improvement  2. CYSTIC ACNE - doxycycline (VIBRAMYCIN) 100 MG capsule; Take 1 capsule (100 mg total) by mouth daily.  Dispense: 90 capsule; Refill: 3   Shirline Frees, NP

## 2016-06-10 NOTE — Patient Instructions (Addendum)
It was great seeing you again  Your exam is consistent with Sciatica   The steroid injection may take up to 48 hours to start working  I have sent in a prescription for Flexeril, this is a muscle relaxer. It can make you sleepy  Follow up if no improvement   Sciatica Sciatica is pain, weakness, numbness, or tingling along the path of the sciatic nerve. The nerve starts in the lower back and runs down the back of each leg. The nerve controls the muscles in the lower leg and in the back of the knee, while also providing sensation to the back of the thigh, lower leg, and the sole of your foot. Sciatica is a symptom of another medical condition. For instance, nerve damage or certain conditions, such as a herniated disk or bone spur on the spine, pinch or put pressure on the sciatic nerve. This causes the pain, weakness, or other sensations normally associated with sciatica. Generally, sciatica only affects one side of the body. CAUSES   Herniated or slipped disc.  Degenerative disk disease.  A pain disorder involving the narrow muscle in the buttocks (piriformis syndrome).  Pelvic injury or fracture.  Pregnancy.  Tumor (rare). SYMPTOMS  Symptoms can vary from mild to very severe. The symptoms usually travel from the low back to the buttocks and down the back of the leg. Symptoms can include:  Mild tingling or dull aches in the lower back, leg, or hip.  Numbness in the back of the calf or sole of the foot.  Burning sensations in the lower back, leg, or hip.  Sharp pains in the lower back, leg, or hip.  Leg weakness.  Severe back pain inhibiting movement. These symptoms may get worse with coughing, sneezing, laughing, or prolonged sitting or standing. Also, being overweight may worsen symptoms. DIAGNOSIS  Your caregiver will perform a physical exam to look for common symptoms of sciatica. He or she may ask you to do certain movements or activities that would trigger sciatic nerve  pain. Other tests may be performed to find the cause of the sciatica. These may include:  Blood tests.  X-rays.  Imaging tests, such as an MRI or CT scan. TREATMENT  Treatment is directed at the cause of the sciatic pain. Sometimes, treatment is not necessary and the pain and discomfort goes away on its own. If treatment is needed, your caregiver may suggest:  Over-the-counter medicines to relieve pain.  Prescription medicines, such as anti-inflammatory medicine, muscle relaxants, or narcotics.  Applying heat or ice to the painful area.  Steroid injections to lessen pain, irritation, and inflammation around the nerve.  Reducing activity during periods of pain.  Exercising and stretching to strengthen your abdomen and improve flexibility of your spine. Your caregiver may suggest losing weight if the extra weight makes the back pain worse.  Physical therapy.  Surgery to eliminate what is pressing or pinching the nerve, such as a bone spur or part of a herniated disk. HOME CARE INSTRUCTIONS   Only take over-the-counter or prescription medicines for pain or discomfort as directed by your caregiver.  Apply ice to the affected area for 20 minutes, 3-4 times a day for the first 48-72 hours. Then try heat in the same way.  Exercise, stretch, or perform your usual activities if these do not aggravate your pain.  Attend physical therapy sessions as directed by your caregiver.  Keep all follow-up appointments as directed by your caregiver.  Do not wear high heels or  shoes that do not provide proper support.  Check your mattress to see if it is too soft. A firm mattress may lessen your pain and discomfort. SEEK IMMEDIATE MEDICAL CARE IF:   You lose control of your bowel or bladder (incontinence).  You have increasing weakness in the lower back, pelvis, buttocks, or legs.  You have redness or swelling of your back.  You have a burning sensation when you urinate.  You have pain  that gets worse when you lie down or awakens you at night.  Your pain is worse than you have experienced in the past.  Your pain is lasting longer than 4 weeks.  You are suddenly losing weight without reason. MAKE SURE YOU:  Understand these instructions.  Will watch your condition.  Will get help right away if you are not doing well or get worse.   This information is not intended to replace advice given to you by your health care provider. Make sure you discuss any questions you have with your health care provider.   Document Released: 10/18/2001 Document Revised: 07/15/2015 Document Reviewed: 03/04/2012 Elsevier Interactive Patient Education [employer]

## 2016-09-16 ENCOUNTER — Encounter: Payer: Self-pay | Admitting: Adult Health

## 2016-09-16 ENCOUNTER — Ambulatory Visit (INDEPENDENT_AMBULATORY_CARE_PROVIDER_SITE_OTHER): Payer: Medicare Other | Admitting: Adult Health

## 2016-09-16 VITALS — BP 132/80 | Temp 98.6°F | Ht 59.0 in | Wt 288.4 lb

## 2016-09-16 DIAGNOSIS — E6609 Other obesity due to excess calories: Secondary | ICD-10-CM | POA: Diagnosis not present

## 2016-09-16 DIAGNOSIS — IMO0001 Reserved for inherently not codable concepts without codable children: Secondary | ICD-10-CM

## 2016-09-16 DIAGNOSIS — I10 Essential (primary) hypertension: Secondary | ICD-10-CM

## 2016-09-16 DIAGNOSIS — Z6841 Body Mass Index (BMI) 40.0 and over, adult: Secondary | ICD-10-CM

## 2016-09-16 DIAGNOSIS — E785 Hyperlipidemia, unspecified: Secondary | ICD-10-CM

## 2016-09-16 DIAGNOSIS — Z23 Encounter for immunization: Secondary | ICD-10-CM

## 2016-09-16 DIAGNOSIS — M159 Polyosteoarthritis, unspecified: Secondary | ICD-10-CM

## 2016-09-16 DIAGNOSIS — M150 Primary generalized (osteo)arthritis: Secondary | ICD-10-CM

## 2016-09-16 DIAGNOSIS — G5602 Carpal tunnel syndrome, left upper limb: Secondary | ICD-10-CM

## 2016-09-16 LAB — CBC WITH DIFFERENTIAL/PLATELET
BASOS ABS: 0 10*3/uL (ref 0.0–0.1)
BASOS PCT: 0.6 % (ref 0.0–3.0)
EOS ABS: 0.1 10*3/uL (ref 0.0–0.7)
Eosinophils Relative: 2.1 % (ref 0.0–5.0)
HEMATOCRIT: 36.3 % (ref 36.0–46.0)
HEMOGLOBIN: 12.1 g/dL (ref 12.0–15.0)
LYMPHS PCT: 32.5 % (ref 12.0–46.0)
Lymphs Abs: 2.2 10*3/uL (ref 0.7–4.0)
MCHC: 33.3 g/dL (ref 30.0–36.0)
MCV: 89 fl (ref 78.0–100.0)
MONOS PCT: 4.9 % (ref 3.0–12.0)
Monocytes Absolute: 0.3 10*3/uL (ref 0.1–1.0)
NEUTROS ABS: 4.1 10*3/uL (ref 1.4–7.7)
Neutrophils Relative %: 59.9 % (ref 43.0–77.0)
Platelets: 242 10*3/uL (ref 150.0–400.0)
RBC: 4.07 Mil/uL (ref 3.87–5.11)
RDW: 15.2 % (ref 11.5–15.5)
WBC: 6.8 10*3/uL (ref 4.0–10.5)

## 2016-09-16 LAB — POC URINALSYSI DIPSTICK (AUTOMATED)
Bilirubin, UA: NEGATIVE
Glucose, UA: NEGATIVE
Ketones, UA: NEGATIVE
Leukocytes, UA: NEGATIVE
NITRITE UA: NEGATIVE
PH UA: 5.5
PROTEIN UA: NEGATIVE
RBC UA: NEGATIVE
SPEC GRAV UA: 1.015
UROBILINOGEN UA: 0.2

## 2016-09-16 LAB — BASIC METABOLIC PANEL
BUN: 14 mg/dL (ref 6–23)
CALCIUM: 10.7 mg/dL — AB (ref 8.4–10.5)
CHLORIDE: 106 meq/L (ref 96–112)
CO2: 27 meq/L (ref 19–32)
CREATININE: 0.74 mg/dL (ref 0.40–1.20)
GFR: 100.4 mL/min (ref >60.00)
Glucose, Bld: 87 mg/dL (ref 70–99)
Potassium: 3.9 mEq/L (ref 3.5–5.1)
SODIUM: 142 meq/L (ref 135–145)

## 2016-09-16 LAB — HEPATIC FUNCTION PANEL
ALBUMIN: 4.5 g/dL (ref 3.5–5.2)
ALK PHOS: 80 U/L (ref 39–117)
ALT: 18 U/L (ref 0–35)
AST: 18 U/L (ref 0–37)
BILIRUBIN DIRECT: 0 mg/dL (ref 0.0–0.3)
TOTAL PROTEIN: 8.6 g/dL — AB (ref 6.0–8.3)
Total Bilirubin: 0.4 mg/dL (ref 0.2–1.2)

## 2016-09-16 LAB — LIPID PANEL
CHOL/HDL RATIO: 5
Cholesterol: 235 mg/dL — ABNORMAL HIGH (ref 0–200)
HDL: 48.1 mg/dL (ref >39.00)
LDL Cholesterol: 153 mg/dL — ABNORMAL HIGH (ref 0–99)
NONHDL: 186.81
Triglycerides: 168 mg/dL — ABNORMAL HIGH (ref 0.0–149.0)
VLDL: 33.6 mg/dL (ref 0.0–40.0)

## 2016-09-16 LAB — TSH: TSH: 0.83 u[IU]/mL (ref 0.35–4.50)

## 2016-09-16 MED ORDER — MELOXICAM 15 MG PO TABS: ORAL_TABLET | ORAL | 1 refills | Status: DC

## 2016-09-16 NOTE — Patient Instructions (Addendum)
It was great seeing you today!  I have sent in a medication called Mobic for your arthritic pain.   Pick up a wrist splint for your left wrist  I will follow up with you regarding your blood work   If you would like to try a knee injection, please let me know  Health Maintenance, Female Adopting a healthy lifestyle and getting preventive care can go a long way to promote health and wellness. Talk with your health care provider about what schedule of regular examinations is right for you. This is a good chance for you to check in with your provider about disease prevention and staying healthy. In between checkups, there are plenty of things you can do on your own. Experts have done a lot of research about which lifestyle changes and preventive measures are most likely to keep you healthy. Ask your health care provider for more information. WEIGHT AND DIET  Eat a healthy diet  Be sure to include plenty of vegetables, fruits, low-fat dairy products, and lean protein.  Do not eat a lot of foods high in solid fats, added sugars, or salt.  Get regular exercise. This is one of the most important things you can do for your health.  Most adults should exercise for at least 150 minutes each week. The exercise should increase your heart rate and make you sweat (moderate-intensity exercise).  Most adults should also do strengthening exercises at least twice a week. This is in addition to the moderate-intensity exercise.  Maintain a healthy weight  Body mass index (BMI) is a measurement that can be used to identify possible weight problems. It estimates body fat based on height and weight. Your health care provider can help determine your BMI and help you achieve or maintain a healthy weight.  For females 5 years of age and older:   A BMI below 18.5 is considered underweight.  A BMI of 18.5 to 24.9 is normal.  A BMI of 25 to 29.9 is considered overweight.  A BMI of 30 and above is  considered obese.  Watch levels of cholesterol and blood lipids  You should start having your blood tested for lipids and cholesterol at 68 years of age, then have this test every 5 years.  You may need to have your cholesterol levels checked more often if:  Your lipid or cholesterol levels are high.  You are older than 69 years of age.  You are at high risk for heart disease.  CANCER SCREENING   Lung Cancer  Lung cancer screening is recommended for adults 60-68 years old who are at high risk for lung cancer because of a history of smoking.  A yearly low-dose CT scan of the lungs is recommended for people who:  Currently smoke.  Have quit within the past 15 years.  Have at least a 30-pack-year history of smoking. A pack year is smoking an average of one pack of cigarettes a day for 1 year.  Yearly screening should continue until it has been 15 years since you quit.  Yearly screening should stop if you develop a health problem that would prevent you from having lung cancer treatment.  Breast Cancer  Practice breast self-awareness. This means understanding how your breasts normally appear and feel.  It also means doing regular breast self-exams. Let your health care provider know about any changes, no matter how small.  If you are in your 20s or 30s, you should have a clinical breast exam (CBE) by  a health care provider every 1-3 years as part of a regular health exam.  If you are 3 or older, have a CBE every year. Also consider having a breast X-ray (mammogram) every year.  If you have a family history of breast cancer, talk to your health care provider about genetic screening.  If you are at high risk for breast cancer, talk to your health care provider about having an MRI and a mammogram every year.  Breast cancer gene (BRCA) assessment is recommended for women who have family members with BRCA-related cancers. BRCA-related cancers  include:  Breast.  Ovarian.  Tubal.  Peritoneal cancers.  Results of the assessment will determine the need for genetic counseling and BRCA1 and BRCA2 testing. Cervical Cancer Your health care provider may recommend that you be screened regularly for cancer of the pelvic organs (ovaries, uterus, and vagina). This screening involves a pelvic examination, including checking for microscopic changes to the surface of your cervix (Pap test). You may be encouraged to have this screening done every 3 years, beginning at age 68.  For women ages 10-65, health care providers may recommend pelvic exams and Pap testing every 3 years, or they may recommend the Pap and pelvic exam, combined with testing for human papilloma virus (HPV), every 5 years. Some types of HPV increase your risk of cervical cancer. Testing for HPV may also be done on women of any age with unclear Pap test results.  Other health care providers may not recommend any screening for nonpregnant women who are considered low risk for pelvic cancer and who do not have symptoms. Ask your health care provider if a screening pelvic exam is right for you.  If you have had past treatment for cervical cancer or a condition that could lead to cancer, you need Pap tests and screening for cancer for at least 20 years after your treatment. If Pap tests have been discontinued, your risk factors (such as having a new sexual partner) need to be reassessed to determine if screening should resume. Some women have medical problems that increase the chance of getting cervical cancer. In these cases, your health care provider may recommend more frequent screening and Pap tests. Colorectal Cancer  This type of cancer can be detected and often prevented.  Routine colorectal cancer screening usually begins at 68 years of age and continues through 68 years of age.  Your health care provider may recommend screening at an earlier age if you have risk factors for  colon cancer.  Your health care provider may also recommend using home test kits to check for hidden blood in the stool.  A small camera at the end of a tube can be used to examine your colon directly (sigmoidoscopy or colonoscopy). This is done to check for the earliest forms of colorectal cancer.  Routine screening usually begins at age 61.  Direct examination of the colon should be repeated every 5-10 years through 68 years of age. However, you may need to be screened more often if early forms of precancerous polyps or small growths are found. Skin Cancer  Check your skin from head to toe regularly.  Tell your health care provider about any new moles or changes in moles, especially if there is a change in a mole's shape or color.  Also tell your health care provider if you have a mole that is larger than the size of a pencil eraser.  Always use sunscreen. Apply sunscreen liberally and repeatedly throughout the day.  Protect yourself by wearing long sleeves, pants, a wide-brimmed hat, and sunglasses whenever you are outside. HEART DISEASE, DIABETES, AND HIGH BLOOD PRESSURE   High blood pressure causes heart disease and increases the risk of stroke. High blood pressure is more likely to develop in:  People who have blood pressure in the high end of the normal range (130-139/85-89 mm Hg).  People who are overweight or obese.  People who are African American.  If you are 18-39 years of age, have your blood pressure checked every 3-5 years. If you are 40 years of age or older, have your blood pressure checked every year. You should have your blood pressure measured twice--once when you are at a hospital or clinic, and once when you are not at a hospital or clinic. Record the average of the two measurements. To check your blood pressure when you are not at a hospital or clinic, you can use:  An automated blood pressure machine at a pharmacy.  A home blood pressure monitor.  If you  are between 55 years and 79 years old, ask your health care provider if you should take aspirin to prevent strokes.  Have regular diabetes screenings. This involves taking a blood sample to check your fasting blood sugar level.  If you are at a normal weight and have a low risk for diabetes, have this test once every three years after 68 years of age.  If you are overweight and have a high risk for diabetes, consider being tested at a younger age or more often. PREVENTING INFECTION  Hepatitis B  If you have a higher risk for hepatitis B, you should be screened for this virus. You are considered at high risk for hepatitis B if:  You were born in a country where hepatitis B is common. Ask your health care provider which countries are considered high risk.  Your parents were born in a high-risk country, and you have not been immunized against hepatitis B (hepatitis B vaccine).  You have HIV or AIDS.  You use needles to inject street drugs.  You live with someone who has hepatitis B.  You have had sex with someone who has hepatitis B.  You get hemodialysis treatment.  You take certain medicines for conditions, including cancer, organ transplantation, and autoimmune conditions. Hepatitis C  Blood testing is recommended for:  Everyone born from 1945 through 1965.  Anyone with known risk factors for hepatitis C. Sexually transmitted infections (STIs)  You should be screened for sexually transmitted infections (STIs) including gonorrhea and chlamydia if:  You are sexually active and are younger than 68 years of age.  You are older than 68 years of age and your health care provider tells you that you are at risk for this type of infection.  Your sexual activity has changed since you were last screened and you are at an increased risk for chlamydia or gonorrhea. Ask your health care provider if you are at risk.  If you do not have HIV, but are at risk, it may be recommended that you  take a prescription medicine daily to prevent HIV infection. This is called pre-exposure prophylaxis (PrEP). You are considered at risk if:  You are sexually active and do not regularly use condoms or know the HIV status of your partner(s).  You take drugs by injection.  You are sexually active with a partner who has HIV. Talk with your health care provider about whether you are at high risk of being infected   with HIV. If you choose to begin PrEP, you should first be tested for HIV. You should then be tested every 3 months for as long as you are taking PrEP.  PREGNANCY   If you are premenopausal and you may become pregnant, ask your health care provider about preconception counseling.  If you may become pregnant, take 400 to 800 micrograms (mcg) of folic acid every day.  If you want to prevent pregnancy, talk to your health care provider about birth control (contraception). OSTEOPOROSIS AND MENOPAUSE   Osteoporosis is a disease in which the bones lose minerals and strength with aging. This can result in serious bone fractures. Your risk for osteoporosis can be identified using a bone density scan.  If you are 28 years of age or older, or if you are at risk for osteoporosis and fractures, ask your health care provider if you should be screened.  Ask your health care provider whether you should take a calcium or vitamin D supplement to lower your risk for osteoporosis.  Menopause may have certain physical symptoms and risks.  Hormone replacement therapy may reduce some of these symptoms and risks. Talk to your health care provider about whether hormone replacement therapy is right for you.  HOME CARE INSTRUCTIONS   Schedule regular health, dental, and eye exams.  Stay current with your immunizations.   Do not use any tobacco products including cigarettes, chewing tobacco, or electronic cigarettes.  If you are pregnant, do not drink alcohol.  If you are breastfeeding, limit how  much and how often you drink alcohol.  Limit alcohol intake to no more than 1 drink per day for nonpregnant women. One drink equals 12 ounces of beer, 5 ounces of wine, or 1 ounces of hard liquor.  Do not use street drugs.  Do not share needles.  Ask your health care provider for help if you need support or information about quitting drugs.  Tell your health care provider if you often feel depressed.  Tell your health care provider if you have ever been abused or do not feel safe at home.   This information is not intended to replace advice given to you by your health care provider. Make sure you discuss any questions you have with your health care provider.   Document Released: 05/09/2011 Document Revised: 11/14/2014 Document Reviewed: 09/25/2013 Elsevier Interactive Patient Education [employer].

## 2016-09-16 NOTE — Progress Notes (Signed)
Subjective:    Patient ID: Carla Nash, female    DOB: Oct 23, 1946, 70 y.o.   MRN: [id number]  HPI  Patient presents for follow up exam due to history of  has a past medical history of Arthritis; Asthma; Borderline diabetes; Diverticulosis; Glaucoma; History of DVT of lower extremity (1990s); and Obesity.   All immunizations and health maintenance protocols were reviewed with the patient and needed orders were placed. She will get her annual flu vaccination today  Appropriate screening laboratory values were ordered for the patient including screening of hyperlipidemia, renal function and hepatic function.  Medication reconciliation,  past medical history, social history, problem list and allergies were reviewed in detail with the patient  Goals were established with regard to weight loss, exercise, and  diet in compliance with medications. She is not doing any formal exercise due to arthritic pain but she has cut out junk food from her diet and has lost 10 pounds.   Wt Readings from Last 3 Encounters:  09/16/16 288 lb 6.4 oz (130.8 kg)  06/10/16 298 lb 9.6 oz (135.4 kg)  09/10/15 278 lb (126.1 kg)   End of life planning was discussed.  She reports worsening arthritic pain in her knees and left shoulder. She has not been using Mobic as prescribed but has been using Tylenol as needed with minimal relief. She does not want to see an ortho as she does not want to have surgery.   She is also complaining of left sided wrist pain with pain/tingling that radiates to the thumb and pointer finger. She does not notice a time of day that is worse. She does not use the computer a lot. She does not know when this discomfort started. She denies any changes in grip strength.   She is up to date on colonoscopy. She reports having a mammogram in July ( I do not have these records). She sees her eye doctor yearly due to h/o glaucoma. She goes to the dentist routinely.   Review of Systems    Constitutional: Negative.   HENT: Negative.   Eyes: Negative.   Respiratory: Negative.   Cardiovascular: Negative.   Gastrointestinal: Negative.   Endocrine: Negative.   Genitourinary: Negative.   Musculoskeletal: Positive for arthralgias and gait problem.  Skin: Negative.   Allergic/Immunologic: Negative.   Hematological: Negative.   Psychiatric/Behavioral: Negative.    Past Medical History:  Diagnosis Date  . Arthritis   . Asthma   . Borderline diabetes   . Diverticulosis   . Glaucoma   . History of DVT of lower extremity 1990s  . Obesity     Social History   Social History  . Marital status: Divorced    Spouse name: N/A  . Number of children: 0  . Years of education: N/A   Occupational History  . Retired    Social History Main Topics  . Smoking status: Never Smoker  . Smokeless tobacco: Never Used  . Alcohol use No  . Drug use: No  . Sexual activity: Not on file   Other Topics Concern  . Not on file   Social History Narrative   Divorced for 16 years    No children    Lives with Niece    Regular exercise: no   Caffeine Use:  1-2 drinks daily                Past Surgical History:  Procedure Laterality Date  . ABDOMINAL HYSTERECTOMY  2006  . breast biopsy  2008    . OOPHORECTOMY    . SUPRANUMERARY NIPPLE EXCISION     right breast    Family History  Problem Relation Age of Onset  . Heart disease Mother     CHF  . Arthritis Mother   . Diabetes Mother   . COPD Father   . Cancer Father     lung  . Kidney disease Sister     kidney failure    Allergies  Allergen Reactions  . Penicillins     REACTION: rash, itching    Current Outpatient Prescriptions on File Prior to Visit  Medication Sig Dispense Refill  . acetaminophen (TYLENOL) 500 MG tablet Take 1 tablet (500 mg total) by mouth 3 (three) times daily. 90 tablet prn  . cyclobenzaprine (FLEXERIL) 10 MG tablet Take 1 tablet (10 mg total) by mouth 3 (three) times daily as needed for  muscle spasms. 30 tablet 0  . doxycycline (VIBRAMYCIN) 100 MG capsule Take 1 capsule (100 mg total) by mouth daily. 90 capsule 3  . Multiple Vitamin (MULTIVITAMIN) tablet Take 1 tablet by mouth daily.       No current facility-administered medications on file prior to visit.     BP 132/80   Temp 98.6 F (37 C) (Oral)   Ht 4\' 11"  (1.499 m)   Wt 288 lb 6.4 oz (130.8 kg)   BMI 58.25 kg/m       Objective:   Physical Exam  Constitutional: She is oriented to person, place, and time. She appears well-developed and well-nourished. No distress.  obese  HENT:  Head: Normocephalic and atraumatic.  Right Ear: External ear normal.  Left Ear: External ear normal.  Nose: Nose normal.  Mouth/Throat: Oropharynx is clear and moist. No oropharyngeal exudate.  Eyes: Conjunctivae and EOM are normal. Pupils are equal, round, and reactive to light. Right eye exhibits no discharge. Left eye exhibits no discharge. No scleral icterus.  Neck: Normal range of motion. Neck supple. No JVD present. Carotid bruit is not present. No tracheal deviation present. No thyromegaly present.  Cardiovascular: Normal rate, regular rhythm, normal heart sounds and intact distal pulses.  Exam reveals no gallop and no friction rub.   No murmur heard. Pulmonary/Chest: Effort normal and breath sounds normal. No stridor. No respiratory distress. She has no wheezes. She has no rales. She exhibits no tenderness.  Abdominal: Soft. Bowel sounds are normal. She exhibits no distension and no mass. There is no tenderness. There is no rebound and no guarding.  Genitourinary:  Genitourinary Comments: Fibrous breasts. No masses felt, no dimpling or discharge. Has scar on right breast from previous biopsy.   Musculoskeletal: Normal range of motion. She exhibits tenderness (bilatearl knees and right shoulder). She exhibits no edema or deformity.  She was able to get out of chair and onto exam table with minimal assistance Walks with a  cane + Phalen and Tinels test  Lymphadenopathy:    She has no cervical adenopathy.  Neurological: She is alert and oriented to person, place, and time. She has normal reflexes. She displays normal reflexes. No cranial nerve deficit. She exhibits normal muscle tone. Coordination normal.  Skin: Skin is warm and dry. No rash noted. She is not diaphoretic. No erythema. No pallor.  Psychiatric: She has a normal mood and affect. Her behavior is normal. Judgment and thought content normal.  Nursing note and vitals reviewed.     Assessment & Plan:  1. Elevated lipids - Basic metabolic panel - CBC with Differential/Platelet -  Hepatic function panel - Lipid panel - TSH - POCT Urinalysis Dipstick (Automated) - Consider adding statin 2. Essential hypertension - Near goal without medications - Basic metabolic panel - CBC with Differential/Platelet - Hepatic function panel - Lipid panel - TSH - POCT Urinalysis Dipstick (Automated) - EKG 12-Lead  3. Primary osteoarthritis involving multiple joints - meloxicam (MOBIC) 15 MG tablet; TAKE 1 TABLET (15 MG TOTAL) BY MOUTH DAILY.  Dispense: 90 tablet; Refill: 1 - She will follow up for steroid injection into the knee 4. Need for influenza vaccination  - Flu vaccine HIGH DOSE PF (Fluzone High dose)  5. Class 3 obesity due to excess calories without serious comorbidity with body mass index (BMI) of 50.0 to 59.9 in adult [facility](HCC) - She has done a great job in recent weight loss.  - Continue with current changes in diet - exercise as tolerated  6. Carpal tunnel syndrome of left wrist - Advised wrist splint  - Follow up if no improvement in the next 2 weeks  Carla Freesory Raevyn Sokol, NP

## 2016-09-22 ENCOUNTER — Other Ambulatory Visit: Payer: Self-pay | Admitting: Adult Health

## 2016-09-27 ENCOUNTER — Encounter: Payer: Medicare Other | Admitting: Adult Health

## 2016-10-28 ENCOUNTER — Other Ambulatory Visit: Payer: Self-pay

## 2016-10-28 MED ORDER — SIMVASTATIN 20 MG PO TABS: ORAL_TABLET | ORAL | 3 refills | Status: DC

## 2016-11-11 ENCOUNTER — Other Ambulatory Visit: Payer: Self-pay

## 2016-11-11 ENCOUNTER — Telehealth: Payer: Self-pay | Admitting: Adult Health

## 2016-11-11 MED ORDER — ATORVASTATIN CALCIUM 10 MG PO TABS: 10.0000 mg | ORAL_TABLET | Freq: Every day | ORAL | 0 refills | Status: DC

## 2016-11-11 NOTE — Telephone Encounter (Signed)
Ok to try Lipitor 10 mg. This can be better tolerated.

## 2016-11-11 NOTE — Telephone Encounter (Signed)
[facility]  Are there any alternatives that patient could try?

## 2016-11-11 NOTE — Telephone Encounter (Signed)
Pt states the  simvastatin (ZOCOR) 20 MG tablet  is making her dizzy and sick to her stomach. Pt will no longer take this med. pls advise  [facility] [address]

## 2016-11-11 NOTE — Telephone Encounter (Signed)
Patient notified of this and states she would like to try Lipitor 10mg  - Rx has been sent in. Thanks!

## 2017-01-04 ENCOUNTER — Telehealth: Payer: Self-pay

## 2017-01-04 NOTE — Telephone Encounter (Signed)
Spoke with pt and she c/o one episode of diarrhea last night about 8pm. She reports that she took some OTC medication about 8:30pm and has had no further episodes. Advised pt that if she continues to have episodes of diarrhea/loose stools or has more than 3 in 24 hours to call office. Pt voiced understanding. Nothing further needed at this time.

## 2017-01-25 ENCOUNTER — Ambulatory Visit: Payer: Medicare Other | Admitting: Adult Health

## 2017-02-08 ENCOUNTER — Other Ambulatory Visit: Payer: Self-pay | Admitting: Adult Health

## 2017-02-08 ENCOUNTER — Telehealth: Payer: Self-pay | Admitting: Adult Health

## 2017-02-08 NOTE — Telephone Encounter (Signed)
Patient states that she no longer wants to take Lipitor.  Is there anything I should advise to patient about not taking this medication? Do we need to get her in for a follow up and labs to check lipids?

## 2017-02-08 NOTE — Telephone Encounter (Signed)
Why does she not want to take it? Is she having side effects?

## 2017-02-08 NOTE — Telephone Encounter (Signed)
Pt does not want the refill on chole med

## 2017-02-09 NOTE — Telephone Encounter (Signed)
Left message for patient to return phone call.  

## 2017-02-09 NOTE — Telephone Encounter (Signed)
Patient states that lipitor makes her dizzy and have blurry vision. Patient has appointment tomorrow morning at 8:45 am and would like to discuss more options. Will route to [facility] as FYI.

## 2017-02-10 ENCOUNTER — Encounter: Payer: Self-pay | Admitting: Gastroenterology

## 2017-02-10 ENCOUNTER — Ambulatory Visit (INDEPENDENT_AMBULATORY_CARE_PROVIDER_SITE_OTHER): Payer: Medicare Other | Admitting: Adult Health

## 2017-02-10 ENCOUNTER — Encounter: Payer: Self-pay | Admitting: Adult Health

## 2017-02-10 VITALS — BP 124/78 | Temp 98.4°F | Ht 59.0 in | Wt 264.4 lb

## 2017-02-10 DIAGNOSIS — R197 Diarrhea, unspecified: Secondary | ICD-10-CM

## 2017-02-10 DIAGNOSIS — R1084 Generalized abdominal pain: Secondary | ICD-10-CM | POA: Diagnosis not present

## 2017-02-10 LAB — COMPREHENSIVE METABOLIC PANEL
ALBUMIN: 4.6 g/dL (ref 3.5–5.2)
ALK PHOS: 88 U/L (ref 39–117)
ALT: 15 U/L (ref 0–35)
AST: 18 U/L (ref 0–37)
BILIRUBIN TOTAL: 0.3 mg/dL (ref 0.2–1.2)
BUN: 10 mg/dL (ref 6–23)
CALCIUM: 10.6 mg/dL — AB (ref 8.4–10.5)
CHLORIDE: 105 meq/L (ref 96–112)
CO2: 27 mEq/L (ref 19–32)
Creatinine, Ser: 0.9 mg/dL (ref 0.40–1.20)
GFR: 80 mL/min (ref >60.00)
Glucose, Bld: 99 mg/dL (ref 70–99)
Potassium: 4.6 mEq/L (ref 3.5–5.1)
SODIUM: 142 meq/L (ref 135–145)
TOTAL PROTEIN: 8.1 g/dL (ref 6.0–8.3)

## 2017-02-10 LAB — CBC WITH DIFFERENTIAL/PLATELET
BASOS PCT: 0.8 % (ref 0.0–3.0)
Basophils Absolute: 0.1 10*3/uL (ref 0.0–0.1)
EOS PCT: 3.8 % (ref 0.0–5.0)
Eosinophils Absolute: 0.3 10*3/uL (ref 0.0–0.7)
HEMATOCRIT: 35.2 % — AB (ref 36.0–46.0)
HEMOGLOBIN: 11.5 g/dL — AB (ref 12.0–15.0)
LYMPHS PCT: 47.6 % — AB (ref 12.0–46.0)
Lymphs Abs: 3.1 10*3/uL (ref 0.7–4.0)
MCHC: 32.6 g/dL (ref 30.0–36.0)
MCV: 89.2 fl (ref 78.0–100.0)
Monocytes Absolute: 0.5 10*3/uL (ref 0.1–1.0)
Monocytes Relative: 7.5 % (ref 3.0–12.0)
Neutro Abs: 2.6 10*3/uL (ref 1.4–7.7)
Neutrophils Relative %: 40.3 % — ABNORMAL LOW (ref 43.0–77.0)
Platelets: 291 10*3/uL (ref 150.0–400.0)
RBC: 3.94 Mil/uL (ref 3.87–5.11)
RDW: 15.5 % (ref 11.5–15.5)
WBC: 6.6 10*3/uL (ref 4.0–10.5)

## 2017-02-10 LAB — H. PYLORI ANTIBODY, IGG: H PYLORI IGG: NEGATIVE

## 2017-02-10 MED ORDER — PANTOPRAZOLE SODIUM 40 MG PO TBEC: 40.0000 mg | DELAYED_RELEASE_TABLET | Freq: Every day | ORAL | 3 refills | Status: DC

## 2017-02-10 NOTE — Progress Notes (Signed)
Subjective:    Patient ID: Carla Nash, female    DOB: 1946/01/23, 71 y.o.   MRN: [id number]  HPI  71 year old female who  has a past medical history of Arthritis; Asthma; Borderline diabetes; Diverticulosis; Glaucoma; History of DVT of lower extremity (1990s); and Obesity. She presents today for chronic diarrhea and generalized abdominal pain. This has been an issue for over 1 year. The pain in her stomach is described as " sharp or stabbing" The pain in constant and she has noticed that it is severe after she eats. She also reports constant diarrhea, per patient " it is right after I eat, I have to run to the bathroom.   She is waking up in the morning with a sour taste in her mouth and with a dry mouth.   She denies any fevers or feeling ill but does report " I feel weak when this occurs"   She has found that dairy and vegetables make matters worse   She reports that she has stopped statin do to side effects   Wt Readings from Last 3 Encounters:  02/10/17 264 lb 6.4 oz (119.9 kg)  09/16/16 288 lb 6.4 oz (130.8 kg)  06/10/16 298 lb 9.6 oz (135.4 kg)     Review of Systems See HPI  Past Medical History:  Diagnosis Date  . Arthritis   . Asthma   . Borderline diabetes   . Diverticulosis   . Glaucoma   . History of DVT of lower extremity 1990s  . Obesity     Social History   Social History  . Marital status: Divorced    Spouse name: N/A  . Number of children: 0  . Years of education: N/A   Occupational History  . Retired    Social History Main Topics  . Smoking status: Never Smoker  . Smokeless tobacco: Never Used  . Alcohol use No  . Drug use: No  . Sexual activity: Not on file   Other Topics Concern  . Not on file   Social History Narrative   Divorced for 16 years    No children    Lives with Niece    Regular exercise: no   Caffeine Use:  1-2 drinks daily                Past Surgical History:  Procedure Laterality Date  . ABDOMINAL  HYSTERECTOMY  2006  . breast biopsy 2008    . OOPHORECTOMY    . SUPRANUMERARY NIPPLE EXCISION     right breast    Family History  Problem Relation Age of Onset  . Heart disease Mother     CHF  . Arthritis Mother   . Diabetes Mother   . COPD Father   . Cancer Father     lung  . Kidney disease Sister     kidney failure    Allergies  Allergen Reactions  . Penicillins     REACTION: rash, itching    Current Outpatient Prescriptions on File Prior to Visit  Medication Sig Dispense Refill  . acetaminophen (TYLENOL) 500 MG tablet Take 1 tablet (500 mg total) by mouth 3 (three) times daily. 90 tablet prn  . Multiple Vitamin (MULTIVITAMIN) tablet Take 1 tablet by mouth daily.       No current facility-administered medications on file prior to visit.     BP 124/78 (BP Location: Left Arm, Patient Position: Sitting, Cuff Size: Normal)   Temp 98.4 F (36.9 C) (Oral)  Ht  (1.499 m)   Wt 264 lb 6.4 oz (119.9 kg)   BMI 53.40 kg/m       Objective:   Physical Exam  Constitutional: She appears well-developed and well-nourished. No distress.  Cardiovascular: Normal rate, regular rhythm, normal heart sounds and intact distal pulses.  Exam reveals no gallop and no friction rub.   No murmur heard. Pulmonary/Chest: Breath sounds normal. No respiratory distress. She has no wheezes. She has no rales. She exhibits no tenderness.  Abdominal: Normal appearance and bowel sounds are normal. She exhibits no mass. There is generalized tenderness (worse in epigastric area). There is no rigidity, no rebound, no guarding, no tenderness at McBurney's point and negative Murphy's sign.  Skin: She is not diaphoretic.  Nursing note reviewed.     Assessment & Plan:  1. Generalized abdominal pain - possible GERD vs IBS? She had a colonoscopy in 2010. Will refer to GI and start on Protonix  - Ambulatory referral to Gastroenterology - H. pylori antibody, IgG - CBC with Differential/Platelet -  Comprehensive metabolic panel - pantoprazole (PROTONIX) 40 MG tablet; Take 1 tablet (40 mg total) by mouth daily.  Dispense: 90 tablet; Refill: 3  2. Diarrhea, unspecified type - Ambulatory referral to Gastroenterology - H. pylori antibody, IgG - CBC with Differential/Platelet - Comprehensive metabolic panel - pantoprazole (PROTONIX) 40 MG tablet; Take 1 tablet (40 mg total) by mouth daily.  Dispense: 90 tablet; Refill: 3  Shirline Frees, NP

## 2017-02-14 ENCOUNTER — Ambulatory Visit (INDEPENDENT_AMBULATORY_CARE_PROVIDER_SITE_OTHER): Payer: Medicare Other | Admitting: Adult Health

## 2017-02-14 ENCOUNTER — Telehealth: Payer: Self-pay | Admitting: Adult Health

## 2017-02-14 ENCOUNTER — Encounter: Payer: Self-pay | Admitting: Adult Health

## 2017-02-14 VITALS — BP 110/74 | HR 88 | Temp 98.0°F | Wt 262.4 lb

## 2017-02-14 DIAGNOSIS — G8929 Other chronic pain: Secondary | ICD-10-CM

## 2017-02-14 DIAGNOSIS — M25561 Pain in right knee: Secondary | ICD-10-CM | POA: Diagnosis not present

## 2017-02-14 DIAGNOSIS — M25562 Pain in left knee: Secondary | ICD-10-CM | POA: Diagnosis not present

## 2017-02-14 MED ORDER — METHYLPREDNISOLONE ACETATE 80 MG/ML IJ SUSP: 80.0000 mg | Freq: Once | INTRAMUSCULAR | Status: DC

## 2017-02-14 MED ORDER — METHYLPREDNISOLONE ACETATE 80 MG/ML IJ SUSP
80.0000 mg | Freq: Once | INTRAMUSCULAR | Status: AC
  Administered 2017-02-14: 80 mg via INTRA_ARTICULAR

## 2017-02-14 NOTE — Progress Notes (Signed)
Subjective:    Patient ID: Carla Nash, female    DOB: January 02, 1946, 71 y.o.   MRN: [id number]  HPI  71 year old female who  has a past medical history of Arthritis; Asthma; Borderline diabetes; Diverticulosis; Glaucoma; History of DVT of lower extremity (1990s); and Obesity. She presents to the office today for the complaint of bilateral knee pain. She would like to have both knees injected. She has had steroid injections in the past and responded well to them.   Review of Systems See HPI   Past Medical History:  Diagnosis Date  . Arthritis   . Asthma   . Borderline diabetes   . Diverticulosis   . Glaucoma   . History of DVT of lower extremity 1990s  . Obesity     Social History   Social History  . Marital status: Divorced    Spouse name: N/A  . Number of children: 0  . Years of education: N/A   Occupational History  . Retired    Social History Main Topics  . Smoking status: Never Smoker  . Smokeless tobacco: Never Used  . Alcohol use No  . Drug use: No  . Sexual activity: Not on file   Other Topics Concern  . Not on file   Social History Narrative   Divorced for 16 years    No children    Lives with Niece    Regular exercise: no   Caffeine Use:  1-2 drinks daily                Past Surgical History:  Procedure Laterality Date  . ABDOMINAL HYSTERECTOMY  2006  . breast biopsy 2008    . OOPHORECTOMY    . SUPRANUMERARY NIPPLE EXCISION     right breast    Family History  Problem Relation Age of Onset  . Heart disease Mother     CHF  . Arthritis Mother   . Diabetes Mother   . COPD Father   . Cancer Father     lung  . Kidney disease Sister     kidney failure    Allergies  Allergen Reactions  . Penicillins     REACTION: rash, itching    Current Outpatient Prescriptions on File Prior to Visit  Medication Sig Dispense Refill  . acetaminophen (TYLENOL) 500 MG tablet Take 1 tablet (500 mg total) by mouth 3 (three) times daily. 90 tablet prn   . Multiple Vitamin (MULTIVITAMIN) tablet Take 1 tablet by mouth daily.      . pantoprazole (PROTONIX) 40 MG tablet Take 1 tablet (40 mg total) by mouth daily. (Patient not taking: Reported on 02/14/2017) 90 tablet 3   No current facility-administered medications on file prior to visit.     BP 110/74 (BP Location: Left Arm, Patient Position: Sitting, Cuff Size: Large)   Pulse 88   Temp 98 F (36.7 C) (Oral)   Wt 262 lb 6.4 oz (119 kg)   SpO2 96%   BMI 53.00 kg/m       Objective:   Physical Exam  Constitutional: She is oriented to person, place, and time. She appears well-developed and well-nourished. No distress.  Cardiovascular: Normal rate, regular rhythm, normal heart sounds and intact distal pulses.  Exam reveals no gallop and no friction rub.   No murmur heard. Pulmonary/Chest: Effort normal and breath sounds normal. No respiratory distress. She has no wheezes. She has no rales. She exhibits no tenderness.  Musculoskeletal: She exhibits tenderness (bilateral knees).  She exhibits no edema or deformity.  Neurological: She is alert and oriented to person, place, and time.  Skin: Skin is warm and dry. No rash noted. She is not diaphoretic. No erythema. No pallor.  Psychiatric: She has a normal mood and affect. Her behavior is normal. Judgment and thought content normal.  Nursing note and vitals reviewed.     Assessment & Plan:  1. Chronic pain of both knees Bilateral knee injection  Verbal consent obtained and verified. Sterile betadine prep. Furthur cleansed with alcohol. Topical analgesic spray: Ethyl chloride. Joint: patellofemoral joint, Approached in typical fashion with: anteriolateral Completed without difficulty Meds: 3 cc lidocaine 2% no epi, 1 cc depomedrol /cc Needle:1.5 inch 25 gauge Aftercare instructions and Red flags advised.  Shirline Frees, NP

## 2017-02-14 NOTE — Progress Notes (Signed)
Pre visit review using our clinic review tool, if applicable. No additional management support is needed unless otherwise documented below in the visit note. 

## 2017-02-14 NOTE — Patient Instructions (Signed)
WE NOW OFFER   [facility] FAST TRACK!!!  SAME DAY Appointments for ACUTE CARE  Such as: Sprains, Injuries, cuts, abrasions, rashes, muscle pain, joint pain, back pain Colds, flu, sore throats, headache, allergies, cough, fever  Ear pain, sinus and eye infections Abdominal pain, nausea, vomiting, diarrhea, upset stomach Animal/insect bites  3 Easy Ways to Schedule: Walk-In Scheduling Call in scheduling Mychart Sign-up: [email or web address]         

## 2017-02-14 NOTE — Telephone Encounter (Signed)
Pt states the  pantoprazole (PROTONIX) 40 MG tablet Is causes her to have diarehea, or she thinks that is what is causing it. Pt wants you to know she is not going to take anymore.   [id number] [place], [place] - [address]

## 2017-02-14 NOTE — Telephone Encounter (Signed)
I will route to Cory as FYI.  

## 2017-02-17 ENCOUNTER — Telehealth: Payer: Self-pay | Admitting: Adult Health

## 2017-02-17 NOTE — Telephone Encounter (Signed)
° ° ° °  Pt call to say she received a recorded call asking when she had her mamo she said she had it  July 2017

## 2017-02-17 NOTE — Telephone Encounter (Signed)
I updated patient's chart with this information. Thanks!

## 2017-03-11 ENCOUNTER — Other Ambulatory Visit: Payer: Self-pay | Admitting: Adult Health

## 2017-03-11 DIAGNOSIS — M150 Primary generalized (osteo)arthritis: Principal | ICD-10-CM

## 2017-03-11 DIAGNOSIS — M159 Polyosteoarthritis, unspecified: Secondary | ICD-10-CM

## 2017-03-14 NOTE — Telephone Encounter (Signed)
Ok to refill for one year  

## 2017-03-15 ENCOUNTER — Other Ambulatory Visit (INDEPENDENT_AMBULATORY_CARE_PROVIDER_SITE_OTHER): Payer: Medicare Other

## 2017-03-15 ENCOUNTER — Telehealth: Payer: Self-pay | Admitting: Adult Health

## 2017-03-15 ENCOUNTER — Encounter (INDEPENDENT_AMBULATORY_CARE_PROVIDER_SITE_OTHER): Payer: Self-pay

## 2017-03-15 ENCOUNTER — Encounter: Payer: Self-pay | Admitting: Gastroenterology

## 2017-03-15 ENCOUNTER — Ambulatory Visit (INDEPENDENT_AMBULATORY_CARE_PROVIDER_SITE_OTHER): Payer: Medicare Other | Admitting: Gastroenterology

## 2017-03-15 ENCOUNTER — Other Ambulatory Visit: Payer: Self-pay

## 2017-03-15 VITALS — BP 130/78 | HR 72 | Ht 59.0 in | Wt 259.0 lb

## 2017-03-15 DIAGNOSIS — R197 Diarrhea, unspecified: Secondary | ICD-10-CM

## 2017-03-15 DIAGNOSIS — R634 Abnormal weight loss: Secondary | ICD-10-CM

## 2017-03-15 LAB — SEDIMENTATION RATE: Sed Rate: 58 mm/hr — ABNORMAL HIGH (ref 0–30)

## 2017-03-15 LAB — IGA: IgA: 398 mg/dL — ABNORMAL HIGH (ref 68–378)

## 2017-03-15 MED ORDER — NA SULFATE-K SULFATE-MG SULF 17.5-3.13-1.6 GM/177ML PO SOLN: 1.0000 | Freq: Once | ORAL | 0 refills | Status: DC

## 2017-03-15 NOTE — Telephone Encounter (Signed)
These medications have been removed from patient's medication list. Thanks!

## 2017-03-15 NOTE — Progress Notes (Signed)
Review of pertinent gastrointestinal problems: 1. Routine risk for colon cancer:  Screening colonoscopy Dr. Christella Hartigan 2008 found diverticulosis only, recommended repeat colon cancer screening with colonoscopy in 10 years.   HPI: This is a    who was referred to me by Shirline Frees, NP  to evaluate  chronic diarrhea   Chief complaint is chronic diarrhea   Blood work last month show essentially normal CBC, CMET.  She has had loose stools for about a year.  Watery >7times per day, not at night.  Urgency +.  Non bloody.  Has not had stool testing.  She has only very mild abdominal discomforts. She's had no fevers or chills  The diarrhea started really fairly acutely about a year ago.  Has tried gas ex.  Imodium usually helps when she takes it  Overall she's been losing weight; 50-60 pounds.  Weighed 300 pounds at PCP scale 06/2016. Now down 40 pounds from that.  She has gas bloating most every day,   Really any foods will cause loose stools.     Review of systems: Pertinent positive and negative review of systems were noted in the above HPI section. Complete review of systems was performed and was otherwise normal.   Past Medical History:  Diagnosis Date  . Arthritis   . Asthma   . Borderline diabetes   . Diverticulosis   . Glaucoma   . History of DVT of lower extremity 1990s  . Obesity     Past Surgical History:  Procedure Laterality Date  . ABDOMINAL HYSTERECTOMY  2006  . breast biopsy 2008    . OOPHORECTOMY    . SUPRANUMERARY NIPPLE EXCISION     right breast    Current Outpatient Prescriptions  Medication Sig Dispense Refill  . acetaminophen (TYLENOL) 500 MG tablet Take 1 tablet (500 mg total) by mouth 3 (three) times daily. 90 tablet prn  . meloxicam (MOBIC) 15 MG tablet TAKE 1 TABLET BY MOUTH DAILY 90 tablet 3  . Multiple Vitamin (MULTIVITAMIN) tablet Take 1 tablet by mouth daily.      . pantoprazole (PROTONIX) 40 MG tablet Take 1 tablet (40 mg total) by  mouth daily. 90 tablet 3   Current Facility-Administered Medications  Medication Dose Route Frequency Provider Last Rate Last Dose  . methylPREDNISolone acetate (DEPO-MEDROL) injection 80 mg  80 mg Intra-articular Once Nafziger, Cory, NP      . methylPREDNISolone acetate (DEPO-MEDROL) injection 80 mg  80 mg Intra-articular Once Shirline Frees, NP        Allergies as of 03/15/2017 - Review Complete 03/15/2017  Allergen Reaction Noted  . Penicillins      Family History  Problem Relation Age of Onset  . Heart disease Mother     CHF  . Arthritis Mother   . Diabetes Mother   . COPD Father   . Cancer Father     lung  . Kidney disease Sister     kidney failure    Social History   Social History  . Marital status: Divorced    Spouse name: N/A  . Number of children: 0  . Years of education: N/A   Occupational History  . Retired    Social History Main Topics  . Smoking status: Never Smoker  . Smokeless tobacco: Never Used  . Alcohol use No  . Drug use: No  . Sexual activity: Not on file   Other Topics Concern  . Not on file   Social History Narrative   Divorced  for 16 years    No children    Lives with Niece    Regular exercise: no   Caffeine Use:  1-2 drinks daily                 Physical Exam: BP 130/78   Pulse 72   Ht 4\' 11"  (1.499 m)   Wt 259 lb (117.5 kg)   BMI 52.31 kg/m  Constitutionaic: She is morbidly obese   Mouth: oral pharynx moist, no lesions Neck: supple no lymphadenopathy Cardiovascular: heart regular rate and rhythm Lungs: clear to auscultation bilaterally Abdomen: soft, nontender, nondistended, no obvious ascites, no peritoneal signs, normal bowel sounds Extremities: no lower extremity edema bilaterally Skin: no lesions on visible extremities   Assessment and plan: 71 y.o. female with  chronic diarrhea  she will get basic stool testing and blood tests including celiac sprue testing and sedimentation rate today.  Her last  colonoscopy was in 2008 and I recommended we repeat that now especially given her chronic diarrhea. I'll plan to biopsy for microscopic colitis if the mucosa is normal. If the above tests are unrevealing then I think she probably deserves further workup given her 40 pound weight loss that is documented over the past year.    Please see the "Patient Instructions" section for addition details about the plan.   Rob Buntinganiel Jacobs, MD [facility] 03/15/2017, 9:43 AM  Cc: Shirline FreesNafziger, Cory, NP

## 2017-03-15 NOTE — Telephone Encounter (Signed)
Pt called in and stated that the pharmacy called to let her know the Meloxicam was ready for pick-up but the patient advised the does not take this medication any longer as it makes her sick.  Meloxicam makes her sick.  Patient states she is also not taking Depo-Medrol, Sulfate OR the Protonix. Patient is asking to remove those meds.  She is only taking the Vitamins and Tylenol.

## 2017-03-15 NOTE — Patient Instructions (Addendum)
You will be set up for a colon[facility] for abdominal pain, [facility].  You will have labs checked today in the basement lab.    Please head down after you check out with the front desk  (ova and parasites, stool culture, c. Diff by PCR and toxin, blood tTG total IgA, ESR)  Start imodium every morning after waking up (loperimide).

## 2017-03-16 LAB — TISSUE TRANSGLUTAMINASE, IGA: Tissue Transglutaminase Ab, IgA: 1 U/mL (ref <4)

## 2017-03-21 ENCOUNTER — Other Ambulatory Visit: Payer: Medicare Other

## 2017-03-21 DIAGNOSIS — R197 Diarrhea, unspecified: Secondary | ICD-10-CM | POA: Diagnosis not present

## 2017-03-21 DIAGNOSIS — R634 Abnormal weight loss: Secondary | ICD-10-CM

## 2017-03-21 LAB — C. DIFFICILE GDH AND TOXIN A/B
C. DIFF TOXIN A/B: NOT DETECTED
C. difficile GDH: NOT DETECTED

## 2017-03-22 LAB — OVA AND PARASITE EXAMINATION: OP: NONE SEEN

## 2017-03-23 LAB — CLOSTRIDIUM DIFFICILE BY PCR

## 2017-03-25 LAB — STOOL CULTURE

## 2017-03-28 ENCOUNTER — Encounter (HOSPITAL_COMMUNITY): Payer: Self-pay | Admitting: *Deleted

## 2017-03-30 ENCOUNTER — Encounter (HOSPITAL_COMMUNITY): Payer: Self-pay | Admitting: *Deleted

## 2017-03-30 ENCOUNTER — Encounter (HOSPITAL_COMMUNITY)
Admission: RE | Disposition: A | Discharge status: Home / Self Care | Payer: Self-pay | Source: Ambulatory Visit | Attending: Gastroenterology

## 2017-03-30 ENCOUNTER — Ambulatory Visit (HOSPITAL_COMMUNITY): Payer: Medicare Other | Admitting: Anesthesiology

## 2017-03-30 ENCOUNTER — Ambulatory Visit (HOSPITAL_COMMUNITY)
Admission: RE | Admit: 2017-03-30 | Discharge: 2017-03-30 | Disposition: A | Discharge status: Home / Self Care | Payer: Medicare Other | Source: Ambulatory Visit | Attending: Gastroenterology | Admitting: Gastroenterology

## 2017-03-30 DIAGNOSIS — R197 Diarrhea, unspecified: Secondary | ICD-10-CM | POA: Diagnosis not present

## 2017-03-30 DIAGNOSIS — Z86718 Personal history of other venous thrombosis and embolism: Secondary | ICD-10-CM | POA: Diagnosis not present

## 2017-03-30 DIAGNOSIS — Z79899 Other long term (current) drug therapy: Secondary | ICD-10-CM | POA: Diagnosis not present

## 2017-03-30 DIAGNOSIS — Z6841 Body Mass Index (BMI) 40.0 and over, adult: Secondary | ICD-10-CM | POA: Diagnosis not present

## 2017-03-30 DIAGNOSIS — K529 Noninfective gastroenteritis and colitis, unspecified: Secondary | ICD-10-CM | POA: Diagnosis not present

## 2017-03-30 DIAGNOSIS — R634 Abnormal weight loss: Secondary | ICD-10-CM

## 2017-03-30 DIAGNOSIS — Z791 Long term (current) use of non-steroidal anti-inflammatories (NSAID): Secondary | ICD-10-CM | POA: Diagnosis not present

## 2017-03-30 DIAGNOSIS — K649 Unspecified hemorrhoids: Secondary | ICD-10-CM | POA: Diagnosis not present

## 2017-03-30 DIAGNOSIS — E669 Obesity, unspecified: Secondary | ICD-10-CM | POA: Diagnosis not present

## 2017-03-30 DIAGNOSIS — K5730 Diverticulosis of large intestine without perforation or abscess without bleeding: Secondary | ICD-10-CM | POA: Diagnosis not present

## 2017-03-30 DIAGNOSIS — K648 Other hemorrhoids: Secondary | ICD-10-CM | POA: Insufficient documentation

## 2017-03-30 DIAGNOSIS — R109 Unspecified abdominal pain: Secondary | ICD-10-CM | POA: Diagnosis not present

## 2017-03-30 HISTORY — DX: Unspecified cataract: H26.9

## 2017-03-30 HISTORY — PX: COLONOSCOPY WITH PROPOFOL: SHX5780

## 2017-03-30 HISTORY — DX: Pneumonia, unspecified organism: J18.9

## 2017-03-30 HISTORY — DX: Chronic embolism and thrombosis of unspecified axillary vein: I82.A29

## 2017-03-30 SURGERY — COLONOSCOPY WITH PROPOFOL
Anesthesia: Monitor Anesthesia Care

## 2017-03-30 MED ORDER — LIDOCAINE 2% (20 MG/ML) 5 ML SYRINGE
INTRAMUSCULAR | Status: DC | PRN
  Administered 2017-03-30: 100 mg via INTRAVENOUS

## 2017-03-30 MED ORDER — SODIUM CHLORIDE 0.9 % IV SOLN: INTRAVENOUS | Status: DC

## 2017-03-30 MED ORDER — LACTATED RINGERS IV SOLN
INTRAVENOUS | Status: DC
  Administered 2017-03-30: 12:00:00 via INTRAVENOUS

## 2017-03-30 MED ORDER — PROPOFOL 10 MG/ML IV BOLUS
INTRAVENOUS | Status: AC
  Filled 2017-03-30: qty 40

## 2017-03-30 MED ORDER — PROPOFOL 500 MG/50ML IV EMUL
INTRAVENOUS | Status: DC | PRN
  Administered 2017-03-30: 100 ug/kg/min via INTRAVENOUS

## 2017-03-30 MED ORDER — LIDOCAINE 2% (20 MG/ML) 5 ML SYRINGE
INTRAMUSCULAR | Status: AC
  Filled 2017-03-30: qty 5

## 2017-03-30 MED ORDER — PROPOFOL 10 MG/ML IV BOLUS
INTRAVENOUS | Status: DC | PRN
  Administered 2017-03-30: 30 mg via INTRAVENOUS
  Administered 2017-03-30: 20 mg via INTRAVENOUS

## 2017-03-30 SURGICAL SUPPLY — 21 items

## 2017-03-30 NOTE — Anesthesia Preprocedure Evaluation (Signed)
Anesthesia Evaluation  Patient identified by MRN, date of birth, ID band Patient awake    Reviewed: Allergy & Precautions, NPO status , Patient's Chart, lab work & pertinent test results  Airway Mallampati: II  TM Distance: >3 FB Neck ROM: Full    Dental no notable dental hx.    Pulmonary neg pulmonary ROS,    Pulmonary exam normal breath sounds clear to auscultation       Cardiovascular hypertension, Normal cardiovascular exam Rhythm:Regular Rate:Normal     Neuro/Psych negative neurological ROS  negative psych ROS   GI/Hepatic negative GI ROS, Neg liver ROS,   Endo/Other  negative endocrine ROS  Renal/GU negative Renal ROS  negative genitourinary   Musculoskeletal negative musculoskeletal ROS (+)   Abdominal   Peds negative pediatric ROS (+)  Hematology negative hematology ROS (+)   Anesthesia Other Findings   Reproductive/Obstetrics negative OB ROS                             Anesthesia Physical Anesthesia Plan  ASA: II  Anesthesia Plan: MAC   Post-op Pain Management:    Induction: Intravenous  Airway Management Planned: Nasal Cannula  Additional Equipment:   Intra-op Plan:   Post-operative Plan:   Informed Consent: I have reviewed the patients History and Physical, chart, labs and discussed the procedure including the risks, benefits and alternatives for the proposed anesthesia with the patient or authorized representative who has indicated his/her understanding and acceptance.   Dental advisory given  Plan Discussed with: CRNA and Surgeon  Anesthesia Plan Comments:         Anesthesia Quick Evaluation  

## 2017-03-30 NOTE — Discharge Instructions (Signed)
YOU HAD AN ENDOSCOPIC PROCEDURE TODAY: Refer to the procedure report and other information in the discharge instructions given to you for any specific questions about what was found during the examination. If this information does not answer your questions, please call [place] office at [phone] to clarify.  ° °YOU SHOULD EXPECT: Some feelings of bloating in the abdomen. Passage of more gas than usual. Walking can help get rid of the air that was put into your GI tract during the procedure and reduce the bloating. If you had a lower endoscopy (such as a colonoscopy or flexible sigmoidoscopy) you may notice spotting of blood in your stool or on the toilet paper. Some abdominal soreness may be present for a day or two, also. ° °DIET: Your first meal following the procedure should be a light meal and then it is ok to progress to your normal diet. A half-sandwich or bowl of soup is an example of a good first meal. Heavy or fried foods are harder to digest and may make you feel nauseous or bloated. Drink plenty of fluids but you should avoid alcoholic beverages for 24 hours. If you had a esophageal dilation, please see attached instructions for diet.   ° °ACTIVITY: Your care partner should take you home directly after the procedure. You should plan to take it easy, moving slowly for the rest of the day. You can resume normal activity the day after the procedure however YOU SHOULD NOT DRIVE, use power tools, machinery or perform tasks that involve climbing or major physical exertion for 24 hours (because of the sedation medicines used during the test).  ° °SYMPTOMS TO REPORT IMMEDIATELY: °A gastroenterologist can be reached at any hour. Please call [phone]  for any of the following symptoms:  °Following lower endoscopy (colonoscopy, flexible sigmoidoscopy) °Excessive amounts of blood in the stool  °Significant tenderness, worsening of abdominal pains  °Swelling of the abdomen that is new, acute  °Fever of 100° or  higher  °Following upper endoscopy (EGD, EUS, ERCP, esophageal dilation) °Vomiting of blood or coffee ground material  °New, significant abdominal pain  °New, significant chest pain or pain under the shoulder blades  °Painful or persistently difficult swallowing  °New shortness of breath  °Black, tarry-looking or red, bloody stools ° °FOLLOW UP:  °If any biopsies were taken you will be contacted by phone or by letter within the next 1-3 weeks. Call [phone]  if you have not heard about the biopsies in 3 weeks.  °Please also call with any specific questions about appointments or follow up tests. ° °

## 2017-03-30 NOTE — H&P (View-Only) (Signed)
Review of pertinent gastrointestinal problems: 1. Routine risk for colon cancer:  Screening colonoscopy Dr. Jacobs 2008 found diverticulosis only, recommended repeat colon cancer screening with colonoscopy in 10 years.   HPI: This is a    who was referred to me by Nafziger, Cory, NP  to evaluate  chronic diarrhea   Chief complaint is chronic diarrhea   Blood work last month show essentially normal CBC, CMET.  She has had loose stools for about a year.  Watery >7times per day, not at night.  Urgency +.  Non bloody.  Has not had stool testing.  She has only very mild abdominal discomforts. She's had no fevers or chills  The diarrhea started really fairly acutely about a year ago.  Has tried gas ex.  Imodium usually helps when she takes it  Overall she's been losing weight; 50-60 pounds.  Weighed 300 pounds at PCP scale 06/2016. Now down 40 pounds from that.  She has gas bloating most every day,   Really any foods will cause loose stools.     Review of systems: Pertinent positive and negative review of systems were noted in the above HPI section. Complete review of systems was performed and was otherwise normal.   Past Medical History:  Diagnosis Date  . Arthritis   . Asthma   . Borderline diabetes   . Diverticulosis   . Glaucoma   . History of DVT of lower extremity 1990s  . Obesity     Past Surgical History:  Procedure Laterality Date  . ABDOMINAL HYSTERECTOMY  2006  . breast biopsy 2008    . OOPHORECTOMY    . SUPRANUMERARY NIPPLE EXCISION     right breast    Current Outpatient Prescriptions  Medication Sig Dispense Refill  . acetaminophen (TYLENOL) 500 MG tablet Take 1 tablet (500 mg total) by mouth 3 (three) times daily. 90 tablet prn  . meloxicam (MOBIC) 15 MG tablet TAKE 1 TABLET BY MOUTH DAILY 90 tablet 3  . Multiple Vitamin (MULTIVITAMIN) tablet Take 1 tablet by mouth daily.      . pantoprazole (PROTONIX) 40 MG tablet Take 1 tablet (40 mg total) by  mouth daily. 90 tablet 3   Current Facility-Administered Medications  Medication Dose Route Frequency Provider Last Rate Last Dose  . methylPREDNISolone acetate (DEPO-MEDROL) injection 80 mg  80 mg Intra-articular Once Nafziger, Cory, NP      . methylPREDNISolone acetate (DEPO-MEDROL) injection 80 mg  80 mg Intra-articular Once Nafziger, Cory, NP        Allergies as of 03/15/2017 - Review Complete 03/15/2017  Allergen Reaction Noted  . Penicillins      Family History  Problem Relation Age of Onset  . Heart disease Mother     CHF  . Arthritis Mother   . Diabetes Mother   . COPD Father   . Cancer Father     lung  . Kidney disease Sister     kidney failure    Social History   Social History  . Marital status: Divorced    Spouse name: N/A  . Number of children: 0  . Years of education: N/A   Occupational History  . Retired    Social History Main Topics  . Smoking status: Never Smoker  . Smokeless tobacco: Never Used  . Alcohol use No  . Drug use: No  . Sexual activity: Not on file   Other Topics Concern  . Not on file   Social History Narrative   Divorced   for 16 years    No children    Lives with Niece    Regular exercise: no   Caffeine Use:  1-2 drinks daily                 Physical Exam: BP 130/78   Pulse 72   Ht 4' 11" (1.499 m)   Wt 259 lb (117.5 kg)   BMI 52.31 kg/m  Constitution[place]: She is morbidly obese   Mouth: oral pharynx moist, no lesions Neck: supple no lymphadenopathy Cardiovascular: heart regular rate and rhythm Lungs: clear to auscultation bilaterally Abdomen: soft, nontender, nondistended, no obvious ascites, no peritoneal signs, normal bowel sounds Extremities: no lower extremity edema bilaterally Skin: no lesions on visible extremities   Assessment and plan: 70 y.o. female with  chronic diarrhea  she will get basic stool testing and blood tests including celiac sprue testing and sedimentation rate today.  Her last  colonoscopy was in 2008 and I recommended we repeat that now especially given her chronic diarrhea. I'll plan to biopsy for microscopic colitis if the mucosa is normal. If the above tests are unrevealing then I think she probably deserves further workup given her 40 pound weight loss that is documented over the past year.    Please see the "Patient Instructions" section for addition details about the plan.   Daniel Jacobs, MD [facility] 03/15/2017, 9:43 AM  Cc: Nafziger, Cory, NP  

## 2017-03-30 NOTE — Anesthesia Postprocedure Evaluation (Signed)
Anesthesia Post Note  Patient: Carla Nash  Procedure(s) Performed: Procedure(s) (LRB): COLONOSCOPY WITH PROPOFOL (N/A)  Patient location during evaluation: PACU Anesthesia Type: MAC Level of consciousness: awake and alert Pain management: pain level controlled Vital Signs Assessment: post-procedure vital signs reviewed and stable Respiratory status: spontaneous breathing, nonlabored ventilation, respiratory function stable and patient connected to nasal cannula oxygen Cardiovascular status: stable and blood pressure returned to baseline Anesthetic complications: no       Last Vitals:  Vitals:   03/30/17 1310 03/30/17 1320  BP: 117/66 128/72  Pulse: 76 64  Resp: (!) 25 16  Temp:      Last Pain:  Vitals:   03/30/17 1300  TempSrc: Oral                 Lajuanda Penick S

## 2017-03-30 NOTE — Interval H&P Note (Signed)
History and Physical Interval Note:  03/30/2017 12:10 PM  Carla Nash  has presented today for surgery, with the diagnosis of abd pain, diarrhea   The various methods of treatment have been discussed with the patient and family. After consideration of risks, benefits and other options for treatment, the patient has consented to  Procedure(s): COLONOSCOPY WITH PROPOFOL (N/A) as a surgical intervention .  The patient's history has been reviewed, patient examined, no change in status, stable for surgery.  I have reviewed the patient's chart and labs.  Questions were answered to the patient's satisfaction.     Rachael FeeJacobs, Wai Litt P

## 2017-03-30 NOTE — Transfer of Care (Signed)
Immediate Anesthesia Transfer of Care Note  Patient: Carla Nash  Procedure(s) Performed: Procedure(s): COLONOSCOPY WITH PROPOFOL (N/A)  Patient Location: PACU  Anesthesia Type:MAC  Level of Consciousness: awake, alert  and oriented  Airway & Oxygen Therapy: Patient Spontanous Breathing and Patient connected to face mask oxygen  Post-op Assessment: Report given to RN and Post -op Vital signs reviewed and stable  Post vital signs: Reviewed and stable  Last Vitals:  Vitals:   03/30/17 1223  BP: 132/71  Resp: (!) 24  Temp: 36.6 C    Last Pain:  Vitals:   03/30/17 1223  TempSrc: Oral         Complications: No apparent anesthesia complications

## 2017-03-30 NOTE — Op Note (Signed)
[facility] Patient Name: Carla Nash Procedure Date: 03/30/2017 MRN: [id number] Attending MD: Rachael Fee , MD Date of Birth: 04/03/1946 CSN: [id number] Age: 71 Admit Type: Outpatient Procedure:                Colonoscopy Indications:              Chronic diarrhea Providers:                Rachael Fee, MD, Tomma Rakers, RN, Oletha Blend, Technician Referring MD:              Medicines:                Monitored Anesthesia Care Complications:            No immediate complications. Estimated blood loss:                            None. Estimated Blood Loss:     Estimated blood loss: none. Procedure:                Pre-Anesthesia Assessment:                           - Prior to the procedure, a History and Physical                            was performed, and patient medications and                            allergies were reviewed. The patient's tolerance of                            previous anesthesia was also reviewed. The risks                            and benefits of the procedure and the sedation                            options and risks were discussed with the patient.                            All questions were answered, and informed consent                            was obtained. Prior Anticoagulants: The patient has                            taken no previous anticoagulant or antiplatelet                            agents. ASA Grade Assessment: III - A patient with                            severe systemic disease. After  reviewing the risks                            and benefits, the patient was deemed in                            satisfactory condition to undergo the procedure.                           After obtaining informed consent, the colonoscope                            was passed under direct vision. Throughout the                            procedure, the patient's blood pressure, pulse,  and                            oxygen saturations were monitored continuously. The                            EC-[id number] ([id number]) scope was introduced through                            the anus and advanced to the the cecum, identified                            by appendiceal orifice and ileocecal valve. I was                            not able to intubate the TI. The colonoscopy was                            performed without difficulty. The patient tolerated                            the procedure well. The quality of the bowel                            preparation was good. The ileocecal valve,                            appendiceal orifice, and rectum were photographed. Scope In: 12:39:47 PM Scope Out: 12:52:54 PM Scope Withdrawal Time: 0 hours 6 minutes 16 seconds  Total Procedure Duration: 0 hours 13 minutes 7 seconds  Findings:      Multiple small and large-mouthed diverticula were found in the left       colon.      Internal hemorrhoids were found. The hemorrhoids were small.      The exam was otherwise without abnormality.      Biopsies for histology were taken with a cold forceps from the entire       colon for evaluation of microscopic colitis. Impression:               - Diverticulosis in  the left colon.                           - Internal hemorrhoids.                           - The examination was otherwise normal.                           - Biopsies were taken with a cold forceps from the                            entire colon for evaluation of microscopic colitis. Moderate Sedation:      N/A- Per Anesthesia Care Recommendation:           - Patient has a contact number available for                            emergencies. The signs and symptoms of potential                            delayed complications were discussed with the                            patient. Return to normal activities tomorrow.                            Written discharge instructions  were provided to the                            patient.                           - Resume previous diet.                           - Continue present medications.                           - No repeat colonoscopy.                           - Await final pathology results, continue once                            daily imodium for now since it seems like it is                            really helping. Procedure Code(s):        --- Professional ---                           [phone], Colonoscopy, flexible; with biopsy, single                            or multiple Diagnosis Code(s):        --- Professional ---  K64.8, Other hemorrhoids                           K52.9, Noninfective gastroenteritis and colitis,                            unspecified                           K57.30, Diverticulosis of large intestine without                            perforation or abscess without bleeding CPT copyright 2016 [employer]. All rights reserved. The codes documented in this report are preliminary and upon coder review may  be revised to meet current compliance requirements. Rachael Feeaniel P Emilyn Ruble, MD 03/30/2017 1:00:59 PM This report has been signed electronically. Number of Addenda: 0

## 2017-04-04 ENCOUNTER — Telehealth: Payer: Self-pay | Admitting: Gastroenterology

## 2017-04-04 NOTE — Telephone Encounter (Signed)
Carla Nash, Daniel P, MD sent to Brandt Loosenaylor, Brently Voorhis L, RN          Please call the patient. The biopsies of her colon were all normal. She should continue once daily imodium since it is helping. Would like CT scan abd/pelvis for chronic diarrhea/weight loss and it that shows nothing signficant then she will not need further testing.

## 2017-04-05 NOTE — Telephone Encounter (Signed)
Pt was notified of the results and states she feels much better and does not want to have any further test at this time.  She will call if her symptoms return

## 2017-04-05 NOTE — Telephone Encounter (Signed)
Left message on machine to call back  

## 2017-04-05 NOTE — Telephone Encounter (Signed)
No answer and no voice mail.

## 2017-04-10 NOTE — Addendum Note (Signed)
Addendum  created 04/10/17 1450 by Reizel Calzada, MD   Sign clinical note    

## 2017-04-10 NOTE — Anesthesia Postprocedure Evaluation (Signed)
Anesthesia Post Note  Patient: Carla Nash  Procedure(s) Performed: Procedure(s) (LRB): COLONOSCOPY WITH PROPOFOL (N/A)     Anesthesia Post Evaluation  Last Vitals:  Vitals:   03/30/17 1310 03/30/17 1320  BP: 117/66 128/72  Pulse: 76 64  Resp: (!) 25 16  Temp:      Last Pain:  Vitals:   03/30/17 1300  TempSrc: Oral                 Titan Karner S

## 2017-04-20 DIAGNOSIS — H2512 Age-related nuclear cataract, left eye: Secondary | ICD-10-CM | POA: Diagnosis not present

## 2017-04-20 DIAGNOSIS — H2513 Age-related nuclear cataract, bilateral: Secondary | ICD-10-CM | POA: Diagnosis not present

## 2017-04-20 DIAGNOSIS — H25013 Cortical age-related cataract, bilateral: Secondary | ICD-10-CM | POA: Diagnosis not present

## 2017-04-20 DIAGNOSIS — H18413 Arcus senilis, bilateral: Secondary | ICD-10-CM | POA: Diagnosis not present

## 2017-04-20 DIAGNOSIS — H25043 Posterior subcapsular polar age-related cataract, bilateral: Secondary | ICD-10-CM | POA: Diagnosis not present

## 2017-07-05 DIAGNOSIS — H2512 Age-related nuclear cataract, left eye: Secondary | ICD-10-CM | POA: Diagnosis not present

## 2017-07-05 DIAGNOSIS — Z880 Allergy status to penicillin: Secondary | ICD-10-CM | POA: Diagnosis not present

## 2017-07-05 DIAGNOSIS — I10 Essential (primary) hypertension: Secondary | ICD-10-CM | POA: Diagnosis not present

## 2017-07-05 DIAGNOSIS — Z79899 Other long term (current) drug therapy: Secondary | ICD-10-CM | POA: Diagnosis not present

## 2017-07-06 DIAGNOSIS — H2511 Age-related nuclear cataract, right eye: Secondary | ICD-10-CM | POA: Diagnosis not present

## 2017-07-26 DIAGNOSIS — Z961 Presence of intraocular lens: Secondary | ICD-10-CM | POA: Diagnosis not present

## 2017-07-26 DIAGNOSIS — I10 Essential (primary) hypertension: Secondary | ICD-10-CM | POA: Diagnosis not present

## 2017-07-26 DIAGNOSIS — H2511 Age-related nuclear cataract, right eye: Secondary | ICD-10-CM | POA: Diagnosis not present

## 2017-07-26 DIAGNOSIS — Z6841 Body Mass Index (BMI) 40.0 and over, adult: Secondary | ICD-10-CM | POA: Diagnosis not present

## 2017-07-26 DIAGNOSIS — M1990 Unspecified osteoarthritis, unspecified site: Secondary | ICD-10-CM | POA: Diagnosis not present

## 2017-07-26 DIAGNOSIS — Z880 Allergy status to penicillin: Secondary | ICD-10-CM | POA: Diagnosis not present

## 2017-08-08 ENCOUNTER — Encounter: Payer: Self-pay | Admitting: Gastroenterology

## 2017-08-24 DIAGNOSIS — Z1231 Encounter for screening mammogram for malignant neoplasm of breast: Secondary | ICD-10-CM | POA: Diagnosis not present

## 2017-08-24 DIAGNOSIS — Z124 Encounter for screening for malignant neoplasm of cervix: Secondary | ICD-10-CM | POA: Diagnosis not present

## 2017-08-24 LAB — HM PAP SMEAR: HM Pap smear: 10182018

## 2017-09-19 ENCOUNTER — Encounter: Payer: Medicare Other | Admitting: Adult Health

## 2017-09-21 ENCOUNTER — Encounter: Payer: Self-pay | Admitting: Adult Health

## 2017-09-21 ENCOUNTER — Ambulatory Visit (INDEPENDENT_AMBULATORY_CARE_PROVIDER_SITE_OTHER): Payer: Medicare Other | Admitting: Adult Health

## 2017-09-21 ENCOUNTER — Other Ambulatory Visit: Payer: Self-pay | Admitting: Adult Health

## 2017-09-21 VITALS — BP 126/84 | Temp 97.6°F | Ht 60.0 in | Wt 226.0 lb

## 2017-09-21 DIAGNOSIS — E785 Hyperlipidemia, unspecified: Secondary | ICD-10-CM | POA: Diagnosis not present

## 2017-09-21 DIAGNOSIS — Z23 Encounter for immunization: Secondary | ICD-10-CM | POA: Diagnosis not present

## 2017-09-21 DIAGNOSIS — E66813 Obesity, class 3: Secondary | ICD-10-CM

## 2017-09-21 DIAGNOSIS — I10 Essential (primary) hypertension: Secondary | ICD-10-CM | POA: Diagnosis not present

## 2017-09-21 DIAGNOSIS — Z6841 Body Mass Index (BMI) 40.0 and over, adult: Secondary | ICD-10-CM

## 2017-09-21 LAB — CBC WITH DIFFERENTIAL/PLATELET
BASOS ABS: 0.1 10*3/uL (ref 0.0–0.1)
Basophils Relative: 0.7 % (ref 0.0–3.0)
EOS PCT: 2.7 % (ref 0.0–5.0)
Eosinophils Absolute: 0.2 10*3/uL (ref 0.0–0.7)
HCT: 33.7 % — ABNORMAL LOW (ref 36.0–46.0)
HEMOGLOBIN: 11 g/dL — AB (ref 12.0–15.0)
LYMPHS ABS: 3.3 10*3/uL (ref 0.7–4.0)
LYMPHS PCT: 46.2 % — AB (ref 12.0–46.0)
MCHC: 32.6 g/dL (ref 30.0–36.0)
MCV: 93 fl (ref 78.0–100.0)
MONOS PCT: 5.8 % (ref 3.0–12.0)
Monocytes Absolute: 0.4 10*3/uL (ref 0.1–1.0)
NEUTROS PCT: 44.6 % (ref 43.0–77.0)
Neutro Abs: 3.2 10*3/uL (ref 1.4–7.7)
PLATELETS: 255 10*3/uL (ref 150.0–400.0)
RBC: 3.63 Mil/uL — AB (ref 3.87–5.11)
RDW: 15 % (ref 11.5–15.5)
WBC: 7.1 10*3/uL (ref 4.0–10.5)

## 2017-09-21 LAB — BASIC METABOLIC PANEL
BUN: 9 mg/dL (ref 6–23)
CALCIUM: 10.7 mg/dL — AB (ref 8.4–10.5)
CO2: 22 mEq/L (ref 19–32)
Chloride: 109 mEq/L (ref 96–112)
Creatinine, Ser: 0.67 mg/dL (ref 0.40–1.20)
GFR: 112.26 mL/min (ref >60.00)
GLUCOSE: 94 mg/dL (ref 70–99)
POTASSIUM: 3.8 meq/L (ref 3.5–5.1)
Sodium: 141 mEq/L (ref 135–145)

## 2017-09-21 LAB — TSH: TSH: 1.15 u[IU]/mL (ref 0.35–4.50)

## 2017-09-21 LAB — HEPATIC FUNCTION PANEL
ALK PHOS: 63 U/L (ref 39–117)
ALT: 10 U/L (ref 0–35)
AST: 17 U/L (ref 0–37)
Albumin: 4.5 g/dL (ref 3.5–5.2)
BILIRUBIN DIRECT: 0.1 mg/dL (ref 0.0–0.3)
BILIRUBIN TOTAL: 0.4 mg/dL (ref 0.2–1.2)
Total Protein: 7.9 g/dL (ref 6.0–8.3)

## 2017-09-21 LAB — LIPID PANEL
CHOL/HDL RATIO: 5
Cholesterol: 197 mg/dL (ref 0–200)
HDL: 43 mg/dL (ref >39.00)
LDL CALC: 116 mg/dL — AB (ref 0–99)
NONHDL: 153.88
Triglycerides: 190 mg/dL — ABNORMAL HIGH (ref 0.0–149.0)
VLDL: 38 mg/dL (ref 0.0–40.0)

## 2017-09-21 NOTE — Patient Instructions (Signed)
It was great seeing you this morning.   Please follow up with me as needed   I will follow up with you regarding your blood work   Have a happy holiday season!

## 2017-09-21 NOTE — Progress Notes (Signed)
Subjective:    Patient ID: Carla Nash, female    DOB: 05-17-1946, 71 y.o.   MRN: [id number]  HPI  Patient presents for yearly preventative medicine examination. She is a pleasant 71 year old female who  has a past medical history of Arthritis, Asthma, Borderline diabetes, Cataract, Diverticulosis, DVT of axillary vein, chronic (HCC) (2001), Glaucoma, History of DVT of lower extremity (1990s), Obesity, and Pneumonia.  All immunizations and health maintenance protocols were reviewed with the patient and needed orders were placed.She has   Appropriate screening laboratory values were ordered for the patient including screening of hyperlipidemia, renal function and hepatic function.  Medication reconciliation,  past medical history, social history, problem list and allergies were reviewed in detail with the patient  Goals were established with regard to weight loss, exercise, and  diet in compliance with medication . She has been eating healthier as she was finding out that certain foods were causing her loose stools.   Wt Readings from Last 3 Encounters:  09/21/17 226 lb (102.5 kg)  03/30/17 259 lb (117.5 kg)  03/15/17 259 lb (117.5 kg)   End of life planning was discussed. She has an advanced directive and living will   She is up to date on her colonoscopy and mammogram   She had bilateral cataract removal this fall and has not had any issues post up.   Review of Systems  Constitutional: Negative.   HENT: Negative.   Eyes: Negative.   Respiratory: Negative.   Cardiovascular: Negative.   Gastrointestinal: Negative.   Endocrine: Negative.   Genitourinary: Negative.   Musculoskeletal: Positive for arthralgias, back pain and gait problem.  Skin: Negative.   Allergic/Immunologic: Negative.   Hematological: Negative.   All other systems reviewed and are negative.  Past Medical History:  Diagnosis Date  . Arthritis   . Asthma   . Borderline diabetes   . Cataract    both  eyes  . Diverticulosis   . 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 Glaucoma   . History of DVT of lower extremity 1990s  . Obesity   . Pneumonia    as child    Social History   Socioeconomic History  . Marital status: Divorced    Spouse name: Not on file  . Number of children: 0  . Years of education: Not on file  . Highest education level: Not on file  Social Needs  . Financial resource strain: Not on file  . Food insecurity - worry: Not on file  . Food insecurity - inability: Not on file  . Transportation needs - medical: Not on file  . Transportation needs - non-medical: Not on file  Occupational History  . Occupation: Retired  Tobacco Use  . Smoking status: Never Smoker  . Smokeless tobacco: Never Used  Substance and Sexual Activity  . Alcohol use: No  . Drug use: No  . Sexual activity: Not on file  Other Topics Concern  . Not on file  Social History Narrative   Divorced for 16 years    No children    Lives with Niece    Regular exercise: no   Caffeine Use:  1-2 drinks daily             Past Surgical History:  Procedure Laterality Date  . ABDOMINAL HYSTERECTOMY  2006   total  . breast biopsy 2008 Right    benign  . CATARACT EXTRACTION  07/05/17 (left eye) and 07/26/17 (right eye)  . COLONOSCOPY WITH PROPOFOL N/A 03/30/2017   Procedure: COLONOSCOPY WITH PROPOFOL;  Surgeon: Rachael Fee, MD;  Location: [facility];  Service: Endoscopy;  Laterality: N/A;  . colonscopy  2008  . OOPHORECTOMY     bilateral  . SUPRANUMERARY NIPPLE EXCISION     right breast    Family History  Problem Relation Age of Onset  . Heart disease Mother        CHF  . Arthritis Mother   . Diabetes Mother   . COPD Father   . Cancer Father        lung  . Kidney disease Sister        kidney failure    Allergies  Allergen Reactions  . Penicillins Shortness Of Breath, Itching and Rash    Has patient had a PCN reaction causing immediate rash,  facial/tongue/throat swelling, SOB or lightheadedness with hypotension: Yes Has patient had a PCN reaction causing severe rash involving mucus membranes or skin necrosis: Unknown Has patient had a PCN reaction that required hospitalization: No Has patient had a PCN reaction occurring within the last 10 years: No If all of the above answers are "NO", then may proceed with Cephalosporin use.    Current Outpatient Medications on File Prior to Visit  Medication Sig Dispense Refill  . acetaminophen (TYLENOL) 500 MG tablet Take 1 tablet (500 mg total) by mouth 3 (three) times daily. (Patient taking differently: Take 500 mg by mouth every 4 (four) hours as needed for mild pain. ) 90 tablet prn  . Multiple Vitamin (MULTIVITAMIN) tablet Take 1 tablet by mouth daily.       Current Facility-Administered Medications on File Prior to Visit  Medication Dose Route Frequency Provider Last Rate Last Dose  . methylPREDNISolone acetate (DEPO-MEDROL) injection 80 mg  80 mg Intra-articular Once Ruthmary Occhipinti, NP      . methylPREDNISolone acetate (DEPO-MEDROL) injection 80 mg  80 mg Intra-articular Once Ivonna Kinnick, NP        BP 126/84 (BP Location: Left Arm)   Temp 97.6 F (36.4 C) (Oral)   Ht 5' (1.524 m)   Wt 226 lb (102.5 kg)   BMI 44.14 kg/m       Objective:   Physical Exam  Constitutional: She is oriented to person, place, and time. She appears well-developed and well-nourished. No distress.  HENT:  Head: Normocephalic and atraumatic.  Right Ear: External ear normal.  Left Ear: External ear normal.  Nose: Nose normal.  Mouth/Throat: Oropharynx is clear and moist. No oropharyngeal exudate.  Eyes: Conjunctivae and EOM are normal. Pupils are equal, round, and reactive to light. Right eye exhibits no discharge. Left eye exhibits no discharge. No scleral icterus.  Neck: Normal range of motion. Neck supple. No JVD present. No tracheal deviation present. No thyromegaly present.  Cardiovascular:  Normal rate, regular rhythm, normal heart sounds and intact distal pulses. Exam reveals no gallop and no friction rub.  No murmur heard. Pulmonary/Chest: Effort normal and breath sounds normal. No stridor. No respiratory distress. She has no wheezes. She has no rales. She exhibits no tenderness.  Abdominal: Soft. Normal appearance and bowel sounds are normal. She exhibits no distension and no mass. There is no tenderness. There is no rebound and no guarding.  Genitourinary:  Genitourinary Comments: Deferred    Musculoskeletal: Normal range of motion. She exhibits no edema, tenderness or deformity.  Walks with a cane. Has a limping gait   Lymphadenopathy:  She has no cervical adenopathy.  Neurological: She is alert and oriented to person, place, and time. She has normal reflexes. She displays normal reflexes. No cranial nerve deficit. She exhibits normal muscle tone. Coordination normal.  Skin: Skin is warm and dry. No rash noted. She is not diaphoretic. No erythema. No pallor.  Psychiatric: She has a normal mood and affect. Her behavior is normal. Judgment and thought content normal.  Nursing note and vitals reviewed.     Assessment & Plan:  1. Essential hypertension - Well controlled without medications - Basic metabolic panel - CBC with Differential/Platelet - Hepatic function panel - Lipid panel - TSH  2. Class 3 severe obesity due to excess calories without serious comorbidity with body mass index (BMI) of 50.0 to 59.9 in adult (HCC) - Continue with healthy eating  - Basic metabolic panel - CBC with Differential/Platelet - Hepatic function panel - Lipid panel - TSH  3. Elevated lipids - Consider statin  - Basic metabolic panel - CBC with Differential/Platelet - Hepatic function panel - Lipid panel - TSH  4. Need for influenza vaccination  - Flu vaccine HIGH DOSE PF (Fluzone High dose)  5. Need for pneumococcal vaccination  - Pneumococcal polysaccharide vaccine  23-valent greater than or equal to 2yo subcutaneous/IM  Shirline Freesory Josecarlos Harriott, NP

## 2017-09-21 NOTE — Addendum Note (Signed)
Addended by: Bonnye FavaKWEI, NANA K on: 09/21/2017 05:15 PM   Modules accepted: Orders

## 2017-09-22 LAB — PTH, INTACT AND CALCIUM
CALCIUM: 10.6 mg/dL — AB (ref 8.6–10.4)
PTH: 36 pg/mL (ref 14–64)

## 2017-09-26 ENCOUNTER — Telehealth: Payer: Self-pay | Admitting: Family Medicine

## 2017-09-26 ENCOUNTER — Other Ambulatory Visit: Payer: Self-pay | Admitting: Family Medicine

## 2017-09-26 NOTE — Telephone Encounter (Signed)
Pt called back to say she was upset when talking with nurse about results and wants the nurse to call her back

## 2017-09-26 NOTE — Telephone Encounter (Signed)
Copied from CRM [phone]. Topic: Quick Communication - Lab Results >> Sep 26, 2017 11:44 AM Crist InfanteHarrald, Kathy J wrote: Pt states [facility] really [facility] and would like another call back.

## 2017-10-03 NOTE — Telephone Encounter (Signed)
Spoke to the pt on 09/27/17.

## 2018-01-14 NOTE — Progress Notes (Signed)
Patient ID: Carla Nash, female   DOB: [birth year], 72 y.o.   MRN: [id number]          Chief complaint: High calcium  History of Present Illness:  Referring physician:  She has been referred here by her PCP for evaluation and management of hypercalcemia   Review of records show that she has had a high calcium since at least 2013 as follows   Lab Results  Component Value Date   CALCIUM 10.6 (H) 09/21/2017   CALCIUM 10.7 (H) 09/21/2017   CALCIUM 10.6 (H) 02/10/2017   CALCIUM 10.7 (H) 09/16/2016   CALCIUM 10.5 09/10/2015   CALCIUM 10.6 (H) 01/07/2014   CALCIUM 10.6 (H) 02/21/2012   CALCIUM 10.1 01/04/2010   CALCIUM 10.2 12/22/2008    The hypercalcemia is not associated with any history of pathologic fractures, renal insufficiency, nephrolithiasis, sarcoidosis, known carcinoma or known thyroid disease  She does not consume excessive amounts of calcium supplements, dairy products or take any vitamin D supplements she does not complain of any unusual fatigue or nausea  Prior serologic and radiologic studies have included:   Lab Results  Component Value Date   PTH 36 09/21/2017   CALCIUM 10.6 (H) 09/21/2017      25 (OH) Vitamin D level not available  No results found for: VD25OH    Allergies as of 01/15/2018      Reactions   Penicillins Shortness Of Breath, Itching, Rash   Has patient had a PCN reaction causing immediate rash, facial/tongue/throat swelling, SOB or lightheadedness with hypotension: Yes Has patient had a PCN reaction causing severe rash involving mucus membranes or skin necrosis: Unknown Has patient had a PCN reaction that required hospitalization: No Has patient had a PCN reaction occurring within the last 10 years: No If all of the above answers are "NO", then may proceed with Cephalosporin use.      Medication List        Accurate as of 01/15/18 11:05 AM. Always use your most recent med list.          acetaminophen 500 MG tablet Commonly  known as:  TYLENOL Take 1 tablet (500 mg total) by mouth 3 (three) times daily.   multivitamin tablet Take 1 tablet by mouth daily.       Allergies:  Allergies  Allergen Reactions  . Penicillins Shortness Of Breath, Itching and Rash    Has patient had a PCN reaction causing immediate rash, facial/tongue/throat swelling, SOB or lightheadedness with hypotension: Yes Has patient had a PCN reaction causing severe rash involving mucus membranes or skin necrosis: Unknown Has patient had a PCN reaction that required hospitalization: No Has patient had a PCN reaction occurring within the last 10 years: No If all of the above answers are "NO", then may proceed with Cephalosporin use.    Past Medical History:  Diagnosis Date  . Arthritis   . Asthma   . Borderline diabetes   . Cataract    both eyes  . Diverticulosis   . 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 Glaucoma   . History of DVT of lower extremity 1990s  . Obesity   . Pneumonia    as child    Past Surgical History:  Procedure Laterality Date  . ABDOMINAL HYSTERECTOMY  2006   total  . breast biopsy 2008 Right    benign  . CATARACT EXTRACTION     07/05/17 (left eye) and 07/26/17 (right eye)  .  COLONOSCOPY WITH PROPOFOL N/A 03/30/2017   Procedure: COLONOSCOPY WITH PROPOFOL;  Surgeon: Rachael FeeJacobs, Daniel P, MD;  Location: [facility];  Service: Endoscopy;  Laterality: N/A;  . colonscopy  2008  . OOPHORECTOMY     bilateral  . SUPRANUMERARY NIPPLE EXCISION     right breast    Family History  Problem Relation Age of Onset  . Heart disease Mother        CHF  . Arthritis Mother   . Diabetes Mother   . COPD Father   . Cancer Father        lung  . Kidney disease Sister        kidney failure    Social History:  reports that  has never smoked. she has never used smokeless tobacco. She reports that she does not drink alcohol or use drugs.  Review of Systems  Constitutional: Positive for weight loss.        She has gradually lost weight with trying over the last couple of years, previously over 300 pounds  HENT: Negative for headaches.   Eyes: Negative for blurred vision.  Respiratory: Negative for shortness of breath.   Cardiovascular: Negative for leg swelling.  Gastrointestinal: Positive for diarrhea.  Endocrine: Negative for fatigue.  Musculoskeletal: Positive for back pain.  Skin: Negative for rash.  Neurological: Negative for weakness.     EXAM:  BP 130/78 (BP Location: Left Arm, Patient Position: Sitting, Cuff Size: Normal)   Pulse 80   Ht 5' (1.524 m)   Wt 215 lb (97.5 kg)   SpO2 97%   BMI 41.99 kg/m   GENERAL: He has moderate generalized obesity  No pallor, clubbing, cervical lymphadenopathy or edema.    Skin:  no rash or pigmentation.  EYES:  small pupils.  Arcus senilis present Conjunctivae normal.  ENT: Oral mucosa and tongue normal.  THYROID:  Not palpable.   HEART:  Normal  S1 and S2; no murmur or click.  CHEST:  Normal shape Lungs:   Vescicular breath sounds heard equally.  No crepitations/ wheeze.  ABDOMEN:  No distention.  Liver and spleen not palpable.  No other mass or tenderness.  NEUROLOGICAL: .Reflexes are 1+ bilaterally at biceps, somewhat difficult to elicit.  SPINE AND JOINTS:  Normal shape of spine and normal peripheral joints.  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 Discussed potential  effects of hyperparathyroidism long-term on bone health, kidney stones and kidney function Explained to patient  that surgery is indicated only there are symptoms of high calcium, calcium level over 1 point above the normal range or known osteoporosis.  Explained that if surgery is indicated this would be done after doing a parathyroid scan and most likely if the patient has single adenoma will need minimally invasive surgery  Recommendations:  Check bone density  Check 24 urine calcium to rule out hypocalciuria  Vitamin D level to be checked   We will check PTH level to confirm diagnosis  She has no significant osteopenia can be followed periodically serial calcium levels   Reather LittlerAjay Annelyse Rey 01/15/2018, 11:05 AM

## 2018-01-15 ENCOUNTER — Ambulatory Visit (INDEPENDENT_AMBULATORY_CARE_PROVIDER_SITE_OTHER): Payer: Medicare Other | Admitting: Endocrinology

## 2018-01-15 ENCOUNTER — Encounter: Payer: Self-pay | Admitting: Endocrinology

## 2018-01-15 DIAGNOSIS — N951 Menopausal and female climacteric states: Secondary | ICD-10-CM

## 2018-01-15 DIAGNOSIS — E213 Hyperparathyroidism, unspecified: Secondary | ICD-10-CM | POA: Diagnosis not present

## 2018-01-15 LAB — BASIC METABOLIC PANEL
BUN: 6 mg/dL (ref 6–23)
CHLORIDE: 104 meq/L (ref 96–112)
CO2: 26 mEq/L (ref 19–32)
CREATININE: 0.55 mg/dL (ref 0.40–1.20)
Calcium: 10.9 mg/dL — ABNORMAL HIGH (ref 8.4–10.5)
GFR: 140.84 mL/min (ref >60.00)
Glucose, Bld: 83 mg/dL (ref 70–99)
POTASSIUM: 3.6 meq/L (ref 3.5–5.1)
SODIUM: 137 meq/L (ref 135–145)

## 2018-01-16 LAB — VITAMIN D 25 HYDROXY (VIT D DEFICIENCY, FRACTURES): VITD: 9.15 ng/mL — AB (ref 30.00–100.00)

## 2018-01-16 LAB — PARATHYROID HORMONE, INTACT (NO CA): PTH: 34 pg/mL (ref 15–65)

## 2018-01-17 ENCOUNTER — Telehealth: Payer: Self-pay | Admitting: Endocrinology

## 2018-01-23 ENCOUNTER — Ambulatory Visit (INDEPENDENT_AMBULATORY_CARE_PROVIDER_SITE_OTHER)
Admission: RE | Admit: 2018-01-23 | Discharge: 2018-01-23 | Disposition: A | Discharge status: Home / Self Care | Payer: Medicare Other | Source: Ambulatory Visit | Attending: Endocrinology | Admitting: Endocrinology

## 2018-01-23 ENCOUNTER — Other Ambulatory Visit: Payer: Medicare Other

## 2018-01-23 DIAGNOSIS — E213 Hyperparathyroidism, unspecified: Secondary | ICD-10-CM | POA: Diagnosis not present

## 2018-01-23 DIAGNOSIS — N951 Menopausal and female climacteric states: Secondary | ICD-10-CM

## 2018-01-24 LAB — CREATININE, URINE, 24 HOUR
CREATININE 24H UR: 311 mg/(24.h) — AB (ref 800–1800)
CREATININE, UR: 38.9 mg/dL

## 2018-01-24 LAB — CALCIUM, URINE, 24 HOUR
CALCIUM 24H UR: 24.8 mg/(24.h) — AB (ref 100.0–300.0)
CALCIUM, URINE: 3.1 mg/dL

## 2018-01-30 ENCOUNTER — Ambulatory Visit (INDEPENDENT_AMBULATORY_CARE_PROVIDER_SITE_OTHER): Payer: Medicare Other | Admitting: Adult Health

## 2018-01-30 ENCOUNTER — Encounter: Payer: Self-pay | Admitting: Adult Health

## 2018-01-30 DIAGNOSIS — Z6841 Body Mass Index (BMI) 40.0 and over, adult: Secondary | ICD-10-CM | POA: Diagnosis not present

## 2018-01-30 NOTE — Progress Notes (Signed)
Subjective:    Patient ID: Carla Nash, female    DOB: 16-Feb-1946, 72 y.o.   MRN: [id number]  HPI  72 year old female who  has a past medical history of Arthritis, Asthma, Borderline diabetes, Cataract, Diverticulosis, DVT of axillary vein, chronic (HCC) (2001), Glaucoma, History of DVT of lower extremity (1990s), Obesity, and Pneumonia.   She presents to the office today for follow up regarding her bone density screen that she had done on 01/23/2018. She was seen by Dr. Lucianne MussKumar for hypercalcemia, who ordered a Bone Density Screen.   She has been working on weight loss through diet and has been able to lose 13 pounds   Wt Readings from Last 3 Encounters:  01/30/18 213 lb (96.6 kg)  01/15/18 215 lb (97.5 kg)  09/21/17 226 lb (102.5 kg)    Review of Systems See HPI   Past Medical History:  Diagnosis Date  . Arthritis   . Asthma   . Borderline diabetes   . Cataract    both eyes  . Diverticulosis   . 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 Glaucoma   . History of DVT of lower extremity 1990s  . Obesity   . Pneumonia    as child    Social History   Socioeconomic History  . Marital status: Divorced    Spouse name: Not on file  . Number of children: 0  . Years of education: Not on file  . Highest education level: Not on file  Occupational History  . Occupation: Retired  Engineer, productionocial Needs  . Financial resource strain: Not on file  . Food insecurity:    Worry: Not on file    Inability: Not on file  . Transportation needs:    Medical: Not on file    Non-medical: Not on file  Tobacco Use  . Smoking status: Never Smoker  . Smokeless tobacco: Never Used  Substance and Sexual Activity  . Alcohol use: No  . Drug use: No  . Sexual activity: Not on file  Lifestyle  . Physical activity:    Days per week: Not on file    Minutes per session: Not on file  . Stress: Not on file  Relationships  . Social connections:    Talks on phone: Not on file    Gets  together: Not on file    Attends religious service: Not on file    Active member of club or organization: Not on file    Attends meetings of clubs or organizations: Not on file    Relationship status: Not on file  . Intimate partner violence:    Fear of current or ex partner: Not on file    Emotionally abused: Not on file    Physically abused: Not on file    Forced sexual activity: Not on file  Other Topics Concern  . Not on file  Social History Narrative   Divorced for 16 years    No children    Lives with Niece    Regular exercise: no   Caffeine Use:  1-2 drinks daily             Past Surgical History:  Procedure Laterality Date  . ABDOMINAL HYSTERECTOMY  2006   total  . breast biopsy 2008 Right    benign  . CATARACT EXTRACTION     07/05/17 (left eye) and 07/26/17 (right eye)  . COLONOSCOPY WITH PROPOFOL N/A 03/30/2017   Procedure:  COLONOSCOPY WITH PROPOFOL;  Surgeon: Rachael Fee, MD;  Location: [facility];  Service: Endoscopy;  Laterality: N/A;  . colonscopy  2008  . OOPHORECTOMY     bilateral  . SUPRANUMERARY NIPPLE EXCISION     right breast    Family History  Problem Relation Age of Onset  . Heart disease Mother        CHF  . Arthritis Mother   . Diabetes Mother   . COPD Father   . Cancer Father        lung  . Kidney disease Sister        kidney failure    Allergies  Allergen Reactions  . Penicillins Shortness Of Breath, Itching and Rash    Has patient had a PCN reaction causing immediate rash, facial/tongue/throat swelling, SOB or lightheadedness with hypotension: Yes Has patient had a PCN reaction causing severe rash involving mucus membranes or skin necrosis: Unknown Has patient had a PCN reaction that required hospitalization: No Has patient had a PCN reaction occurring within the last 10 years: No If all of the above answers are "NO", then may proceed with Cephalosporin use.    Current Outpatient Medications on File Prior to Visit    Medication Sig Dispense Refill  . acetaminophen (TYLENOL) 500 MG tablet Take 1 tablet (500 mg total) by mouth 3 (three) times daily. (Patient taking differently: Take 500 mg by mouth every 4 (four) hours as needed for mild pain. ) 90 tablet prn  . Cholecalciferol (VITAMIN D3 PO) Take 1 tablet by mouth daily.     Current Facility-Administered Medications on File Prior to Visit  Medication Dose Route Frequency Provider Last Rate Last Dose  . methylPREDNISolone acetate (DEPO-MEDROL) injection 80 mg  80 mg Intra-articular Once Kymberlie Brazeau, NP      . methylPREDNISolone acetate (DEPO-MEDROL) injection 80 mg  80 mg Intra-articular Once Mekai Wilkinson, NP        BP 118/72 (BP Location: Left Arm, Cuff Size: Large) Comment (Cuff Size): Thigh Cuff  Temp 97.6 F (36.4 C) (Oral)   Wt 213 lb (96.6 kg)   BMI 41.60 kg/m       Objective:   Physical Exam  Constitutional: She is oriented to person, place, and time. She appears well-developed and well-nourished. No distress.  Cardiovascular: Normal rate, regular rhythm, normal heart sounds and intact distal pulses. Exam reveals no friction rub.  No murmur heard. Pulmonary/Chest: Effort normal and breath sounds normal. No respiratory distress. She has no wheezes. She has no rales. She exhibits no tenderness.  Musculoskeletal:  Walks with a limping gait   Neurological: She is alert and oriented to person, place, and time. She has normal reflexes.  Skin: Skin is warm and dry. No rash noted. She is not diaphoretic. No erythema. No pallor.  Psychiatric: She has a normal mood and affect. Her behavior is normal. Judgment and thought content normal.  Nursing note and vitals reviewed.     Assessment & Plan:  1. Hypercalcemia - Reviewed results of Bone Density screen and labs done by Dr. Lucianne Muss.  - Advised to continue Vitamin D supplement  - Stay away from calcium supplements  - Will repeat Bone Density Screen in 2 years  - Weight bearing exercises    2. Class 3 severe obesity due to excess calories without serious comorbidity with body mass index (BMI) of 40.0 to 44.9 in adult [facility]) - Doing well and congratulated  - keep up the good work   [employer],  NP

## 2018-01-30 NOTE — Progress Notes (Signed)
Subjective:    Patient ID: Carla Nash, female    DOB: 06-23-1946, 72 y.o.   MRN: [id number]  HPI Patient is here to follow-up on her DEXA scan per Dr Lucianne Muss.  DEXA scan reveals osteopenia.  She is already taking a Vitamin D supplement.  She does not do much weight-bearing.  Her mobility is limited.    Review of Systems  Constitutional: Negative for chills, fatigue and fever.  Respiratory: Negative for chest tightness and shortness of breath.   Cardiovascular: Negative for chest pain and palpitations.  Neurological: Positive for numbness.       Numbness in the hands during the night and when she wakes up.        Past Medical History:  Diagnosis Date  . Arthritis   . Asthma   . Borderline diabetes   . Cataract    both eyes  . Diverticulosis   . 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 Glaucoma   . History of DVT of lower extremity 1990s  . Obesity   . Pneumonia    as child    Social History   Socioeconomic History  . Marital status: Divorced    Spouse name: Not on file  . Number of children: 0  . Years of education: Not on file  . Highest education level: Not on file  Occupational History  . Occupation: Retired  Engineer, production  . Financial resource strain: Not on file  . Food insecurity:    Worry: Not on file    Inability: Not on file  . Transportation needs:    Medical: Not on file    Non-medical: Not on file  Tobacco Use  . Smoking status: Never Smoker  . Smokeless tobacco: Never Used  Substance and Sexual Activity  . Alcohol use: No  . Drug use: No  . Sexual activity: Not on file  Lifestyle  . Physical activity:    Days per week: Not on file    Minutes per session: Not on file  . Stress: Not on file  Relationships  . Social connections:    Talks on phone: Not on file    Gets together: Not on file    Attends religious service: Not on file    Active member of club or organization: Not on file    Attends meetings of clubs or  organizations: Not on file    Relationship status: Not on file  . Intimate partner violence:    Fear of current or ex partner: Not on file    Emotionally abused: Not on file    Physically abused: Not on file    Forced sexual activity: Not on file  Other Topics Concern  . Not on file  Social History Narrative   Divorced for 16 years    No children    Lives with Niece    Regular exercise: no   Caffeine Use:  1-2 drinks daily             Past Surgical History:  Procedure Laterality Date  . ABDOMINAL HYSTERECTOMY  2006   total  . breast biopsy 2008 Right    benign  . CATARACT EXTRACTION     07/05/17 (left eye) and 07/26/17 (right eye)  . COLONOSCOPY WITH PROPOFOL N/A 03/30/2017   Procedure: COLONOSCOPY WITH PROPOFOL;  Surgeon: Rachael Fee, MD;  Location: [facility];  Service: Endoscopy;  Laterality: N/A;  . colonscopy  2008  . OOPHORECTOMY  bilateral  . SUPRANUMERARY NIPPLE EXCISION     right breast    Family History  Problem Relation Age of Onset  . Heart disease Mother        CHF  . Arthritis Mother   . Diabetes Mother   . COPD Father   . Cancer Father        lung  . Kidney disease Sister        kidney failure    Allergies  Allergen Reactions  . Penicillins Shortness Of Breath, Itching and Rash    Has patient had a PCN reaction causing immediate rash, facial/tongue/throat swelling, SOB or lightheadedness with hypotension: Yes Has patient had a PCN reaction causing severe rash involving mucus membranes or skin necrosis: Unknown Has patient had a PCN reaction that required hospitalization: No Has patient had a PCN reaction occurring within the last 10 years: No If all of the above answers are "NO", then may proceed with Cephalosporin use.    Current Outpatient Medications on File Prior to Visit  Medication Sig Dispense Refill  . acetaminophen (TYLENOL) 500 MG tablet Take 1 tablet (500 mg total) by mouth 3 (three) times daily. (Patient taking  differently: Take 500 mg by mouth every 4 (four) hours as needed for mild pain. ) 90 tablet prn  . Cholecalciferol (VITAMIN D3 PO) Take 1 tablet by mouth daily.     Current Facility-Administered Medications on File Prior to Visit  Medication Dose Route Frequency Provider Last Rate Last Dose  . methylPREDNISolone acetate (DEPO-MEDROL) injection 80 mg  80 mg Intra-articular Once Nafziger, Cory, NP      . methylPREDNISolone acetate (DEPO-MEDROL) injection 80 mg  80 mg Intra-articular Once Nafziger, Cory, NP        BP 118/72 (BP Location: Left Arm, Cuff Size: Large) Comment (Cuff Size): Thigh Cuff  Temp 97.6 F (36.4 Carla) (Oral)   Wt 213 lb (96.6 kg)   BMI 41.60 kg/m    Objective:   Physical Exam  Constitutional: She appears well-developed and well-nourished. No distress.  Cardiovascular: Normal rate, regular rhythm and normal heart sounds.  Pulmonary/Chest: Effort normal and breath sounds normal.  Skin: She is not diaphoretic.  Nursing note and vitals reviewed.     Assessment & Plan:   1. Hypercalcemia Being followed by Dr Lucianne MussKumar  2. Class 3 severe obesity due to excess calories without serious comorbidity with body mass index (BMI) of 40.0 to 44.9 in adult [facility](HCC) Increase weight-bearing.  Purchase 4 pronged cane as discussed. Continue to work on diet.   Follow up as needed.  Carla Nash  Carla Nash BSN RN NP student

## 2018-08-22 ENCOUNTER — Ambulatory Visit: Payer: Medicare Other | Admitting: Adult Health

## 2018-08-28 NOTE — Progress Notes (Signed)
Subjective:   Carla Nash is a 72 y.o. female who presents for Medicare Annual (Subsequent) preventive examination.  Reports health as good  Apt cory N 10:30   Lives with niece and nephew Has stairs in home and is no problem  Walks with a walker in the home due to OA   Diet Breakfast sometimes; bowl of cereal Lunch; 1/2 sandwich; banana; supper Supper  Spinach or 1/2 sandwich  Has lost 100lbs Put herself on a very strict diet and exercised am and pm  Exercise Will continue to walk does other exercises  Goal is 153 lbs   Health Maintenance Due  Topic Date Due  . Hepatitis C Screening  [birth year]   Educated and agreed to Hep C at next blood draw  Mammogram 08/2017- had this year dexa 01/2018 -2.3  Dr. Henderson Cloud GYN following  No fx   Colonoscopy 03/2017 not due for 10 years    Educated regarding shingrix       Objective:     Vitals: BP 130/86   Pulse 69   Ht 4\' 11"  (1.499 m)   Wt 226 lb (102.5 kg)   SpO2 94%   BMI 45.65 kg/m   Body mass index is 45.65 kg/m.  Advanced Directives 08/29/2018 03/30/2017 03/28/2017  Does Patient Have a Medical Advance Directive? Yes Yes Yes  Type of Advance Directive - Healthcare Power of [place];Living will Healthcare Power of [place];Living will  Does patient want to make changes to medical advance directive? - - No - Patient declined  Copy of Healthcare Power of Attorney in Chart? - No - copy requested No - copy requested    Tobacco Social History   Tobacco Use  Smoking Status Never Smoker  Smokeless Tobacco Never Used     Counseling given: Yes   Clinical Intake:   Past Medical History:  Diagnosis Date  . Arthritis   . Asthma   . Borderline diabetes   . Cataract    both eyes  . Diverticulosis   . 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 Glaucoma   . History of DVT of lower extremity 1990s  . Obesity   . Pneumonia    as child   Past Surgical History:  Procedure Laterality Date  .  ABDOMINAL HYSTERECTOMY  2006   total  . breast biopsy 2008 Right    benign  . CATARACT EXTRACTION     07/05/17 (left eye) and 07/26/17 (right eye)  . COLONOSCOPY WITH PROPOFOL N/A 03/30/2017   Procedure: COLONOSCOPY WITH PROPOFOL;  Surgeon: Rachael Fee, MD;  Location: [facility];  Service: Endoscopy;  Laterality: N/A;  . colonscopy  2008  . OOPHORECTOMY     bilateral  . SUPRANUMERARY NIPPLE EXCISION     right breast   Family History  Problem Relation Age of Onset  . Heart disease Mother        CHF  . Arthritis Mother   . Diabetes Mother   . COPD Father   . Cancer Father        lung  . Kidney disease Sister        kidney failure   Social History   Socioeconomic History  . Marital status: Divorced    Spouse name: Not on file  . Number of children: 0  . Years of education: Not on file  . Highest education level: Not on file  Occupational History  . Occupation: Retired  Engineer, production  .  Financial resource strain: Not on file  . Food insecurity:    Worry: Not on file    Inability: Not on file  . Transportation needs:    Medical: Not on file    Non-medical: Not on file  Tobacco Use  . Smoking status: Never Smoker  . Smokeless tobacco: Never Used  Substance and Sexual Activity  . Alcohol use: No  . Drug use: No  . Sexual activity: Not on file  Lifestyle  . Physical activity:    Days per week: Not on file    Minutes per session: Not on file  . Stress: Not on file  Relationships  . Social connections:    Talks on phone: Not on file    Gets together: Not on file    Attends religious service: Not on file    Active member of club or organization: Not on file    Attends meetings of clubs or organizations: Not on file    Relationship status: Not on file  Other Topics Concern  . Not on file  Social History Narrative   Divorced for 16 years    No children    Lives with Niece    Regular exercise: no   Caffeine Use:  1-2 drinks daily              Outpatient Encounter Medications as of 08/29/2018  Medication Sig  . acetaminophen (TYLENOL) 500 MG tablet Take 1 tablet (500 mg total) by mouth 3 (three) times daily. (Patient taking differently: Take 500 mg by mouth every 4 (four) hours as needed for mild pain. )  . Cholecalciferol (VITAMIN D3 PO) Take 1 tablet by mouth daily.   Facility-Administered Encounter Medications as of 08/29/2018  Medication  . methylPREDNISolone acetate (DEPO-MEDROL) injection 80 mg  . methylPREDNISolone acetate (DEPO-MEDROL) injection 80 mg    Activities of Daily Living In your present state of health, do you have any difficulty performing the following activities: 08/29/2018  Hearing? N  Vision? N  Difficulty concentrating or making decisions? N  Walking or climbing stairs? N  Comment states she goes up and down stairs at home ok; walking with a cane  Dressing or bathing? N  Comment takes her time  Doing errands, shopping? N  Preparing Food and eating ? N  Using the Toilet? N  In the past six months, have you accidently leaked urine? N  Do you have problems with loss of bowel control? N  Managing your Medications? N  Managing your Finances? N  Housekeeping or managing your Housekeeping? N  Some recent data might be hidden    Patient Care Team: Shirline Frees, NP as PCP - General (Family Medicine)    Assessment:   This is a routine wellness examination for Carla Nash.  Exercise Activities and Dietary recommendations Current Exercise Habits: Home exercise routine, Intensity: Mild  Goals    . Cut out extra servings    . Exercise 150 min/wk Moderate Activity     Continue to walk as much as you can Uses mat to do leg exercise     . Increase physical activity       Fall Risk Fall Risk  08/29/2018 08/29/2018 09/21/2017 09/10/2015 07/21/2015  Falls in the past year? No No No No No   Walks with walker at home and cane in the office today Does exercises at home  Niece Aurther Loft provides  transportation  Depression Screen [facility] 2/9 Scores 08/29/2018 08/29/2018 09/21/2017 09/10/2015  PHQ - 2 Score 0  0 0 0     Cognitive Function MMSE - Mini Mental State Exam 08/29/2018  Not completed: (No Data)     Ad8 score reviewed for issues:  Issues making decisions:  Less interest in hobbies / activities:  Repeats questions, stories (family complaining):  Trouble using ordinary gadgets (microwave, computer, phone):  Forgets the month or year:   Mismanaging finances:   Remembering appts:  Daily problems with thinking and/or memory: Ad8 score is=0 Very engaged and pleasant        Immunization History  Administered Date(s) Administered  . Influenza Split 10/20/2011  . Influenza Whole 09/07/2010  . Influenza, High Dose Seasonal PF 07/21/2015, 09/16/2016, 09/21/2017, 08/29/2018  . Pneumococcal Conjugate-13 07/21/2015  . Pneumococcal Polysaccharide-23 03/16/2012, 09/21/2017  . Td 12/09/2008  . Tdap 01/07/2014  . Zoster 03/16/2012     Screening Tests Health Maintenance  Topic Date Due  . Hepatitis C Screening  [birth year]  . MAMMOGRAM  08/25/2019  . TETANUS/TDAP  01/08/2024  . COLONOSCOPY  03/31/2027  . INFLUENZA VACCINE  Completed  . DEXA SCAN  Completed  . PNA vac Low Risk Adult  Completed         Plan:      PCP Notes   Health Maintenance Educated and agreed to Hep C   Mammogram 08/2017- had this year dexa 01/2018 -2.3  Dr. Henderson Cloud GYN following  No fx   Colonoscopy 03/2017 not due for 10 years    Educated regarding shingrix     Abnormal Screens  Working on [employer]   Referrals  none  Patient concerns; none  Nurse Concerns; As noted  Next PCP apt To make fup apt with Lennox Grumbles when she can coordinate with her niece and nephew      I have personally reviewed and noted the following in the patient's chart:   . Medical and social history . Use of alcohol, tobacco or illicit drugs  . Current medications and  supplements . Functional ability and status . Nutritional status . Physical activity . Advanced directives . List of other physicians . Hospitalizations, surgeries, and ER visits in previous 12 months . Vitals . Screenings to include cognitive, depression, and falls . Referrals and appointments  In addition, I have reviewed and discussed with patient certain preventive protocols, quality metrics, and best practice recommendations. A written personalized care plan for preventive services as well as general preventive health recommendations were provided to patient.     Montine Circle, RN  08/29/2018

## 2018-08-29 ENCOUNTER — Ambulatory Visit (INDEPENDENT_AMBULATORY_CARE_PROVIDER_SITE_OTHER): Payer: Medicare Other | Admitting: Adult Health

## 2018-08-29 ENCOUNTER — Encounter: Payer: Self-pay | Admitting: Adult Health

## 2018-08-29 ENCOUNTER — Ambulatory Visit (INDEPENDENT_AMBULATORY_CARE_PROVIDER_SITE_OTHER): Payer: Medicare Other

## 2018-08-29 VITALS — BP 130/86 | HR 69 | Ht 59.0 in | Wt 226.0 lb

## 2018-08-29 DIAGNOSIS — Z23 Encounter for immunization: Secondary | ICD-10-CM | POA: Diagnosis not present

## 2018-08-29 DIAGNOSIS — Z1159 Encounter for screening for other viral diseases: Secondary | ICD-10-CM

## 2018-08-29 DIAGNOSIS — Z0000 Encounter for general adult medical examination without abnormal findings: Secondary | ICD-10-CM

## 2018-08-29 NOTE — Progress Notes (Signed)
I have reviewed documentation for AWV and Advance Care Planning provided by the health coach and agree with documentation. I was immediately available for questions.  

## 2018-08-29 NOTE — Patient Instructions (Addendum)
[name] , Thank you for taking time to come for your Medicare Wellness Visit. I appreciate your ongoing commitment to your health goals. Please review the following plan we discussed and let me know if I can assist you in the future.   Will make an apt once she coordinates with niece or nephew  Shingrix is a vaccine for the prevention of Shingles in Adults 28 and older.  If you are on Medicare, the shingrix is covered under your Part D plan, so you will take both of the vaccines in the series at your pharmacy. Please check with your benefits regarding applicable copays or out of pocket expenses.  The Shingrix is given in 2 vaccines approx 8 weeks apart. You must receive the 2nd dose prior to 6 months from receipt of the first. Please have the pharmacist print out you Immunization  dates for our office records    These are the goals we discussed: Goals    . Cut out extra servings    . Exercise 150 min/wk Moderate Activity     Continue to walk as much as you can Uses mat to do leg exercise     . Increase physical activity       This is a list of the screening recommended for you and due dates:  Health Maintenance  Topic Date Due  .  Hepatitis C: One time screening is recommended by Center for Disease Control  (CDC) for  adults born from [birth year] through 1963.   1946/09/21  . Mammogram  08/25/2019  . Tetanus Vaccine  01/08/2024  . Colon Cancer Screening  03/31/2027  . Flu Shot  Completed  . DEXA scan (bone density measurement)  Completed  . Pneumonia vaccines  Completed      Fall Prevention in the Home Falls can cause injuries. They can happen to people of all ages. There are many things you can do to make your home safe and to help prevent falls. What can I do on the outside of my home?  Regularly fix the edges of walkways and driveways and fix any cracks.  Remove anything that might make you trip as you walk through a door, such as a raised step or threshold.  Trim any  bushes or trees on the path to your home.  Use bright outdoor lighting.  Clear any walking paths of anything that might make someone trip, such as rocks or tools.  Regularly check to see if handrails are loose or broken. Make sure that both sides of any steps have handrails.  Any raised decks and porches should have guardrails on the edges.  Have any leaves, snow, or ice cleared regularly.  Use sand or salt on walking paths during winter.  Clean up any spills in your garage right away. This includes oil or grease spills. What can I do in the bathroom?  Use night lights.  Install grab bars by the toilet and in the tub and shower. Do not use towel bars as grab bars.  Use non-skid mats or decals in the tub or shower.  If you need to sit down in the shower, use a plastic, non-slip stool.  Keep the floor dry. Clean up any water that spills on the floor as soon as it happens.  Remove soap buildup in the tub or shower regularly.  Attach bath mats securely with double-sided non-slip rug tape.  Do not have throw rugs and other things on the floor that can make you trip.  What can I do in the bedroom?  Use night lights.  Make sure that you have a light by your bed that is easy to reach.  Do not use any sheets or blankets that are too big for your bed. They should not hang down onto the floor.  Have a firm chair that has side arms. You can use this for support while you get dressed.  Do not have throw rugs and other things on the floor that can make you trip. What can I do in the kitchen?  Clean up any spills right away.  Avoid walking on wet floors.  Keep items that you use a lot in easy-to-reach places.  If you need to reach something above you, use a strong step stool that has a grab bar.  Keep electrical cords out of the way.  Do not use floor polish or wax that makes floors slippery. If you must use wax, use non-skid floor wax.  Do not have throw rugs and other  things on the floor that can make you trip. What can I do with my stairs?  Do not leave any items on the stairs.  Make sure that there are handrails on both sides of the stairs and use them. Fix handrails that are broken or loose. Make sure that handrails are as long as the stairways.  Check any carpeting to make sure that it is firmly attached to the stairs. Fix any carpet that is loose or worn.  Avoid having throw rugs at the top or bottom of the stairs. If you do have throw rugs, attach them to the floor with carpet tape.  Make sure that you have a light switch at the top of the stairs and the bottom of the stairs. If you do not have them, ask someone to add them for you. What else can I do to help prevent falls?  Wear shoes that: ? Do not have high heels. ? Have rubber bottoms. ? Are comfortable and fit you well. ? Are closed at the toe. Do not wear sandals.  If you use a stepladder: ? Make sure that it is fully opened. Do not climb a closed stepladder. ? Make sure that both sides of the stepladder are locked into place. ? Ask someone to hold it for you, if possible.  Clearly mark and make sure that you can see: ? Any grab bars or handrails. ? First and last steps. ? Where the edge of each step is.  Use tools that help you move around (mobility aids) if they are needed. These include: ? Canes. ? Walkers. ? Scooters. ? Crutches.  Turn on the lights when you go into a dark area. Replace any light bulbs as soon as they burn out.  Set up your furniture so you have a clear path. Avoid moving your furniture around.  If any of your floors are uneven, fix them.  If there are any pets around you, be aware of where they are.  Review your medicines with your doctor. Some medicines can make you feel dizzy. This can increase your chance of falling. Ask your doctor what other things that you can do to help prevent falls. This information is not intended to replace advice given to  you by your health care provider. Make sure you discuss any questions you have with your health care provider. Document Released: 08/20/2009 Document Revised: 03/31/2016 Document Reviewed: 11/28/2014 Elsevier Interactive Patient Education  2018 [place] Maintenance, Female  Adopting a healthy lifestyle and getting preventive care can go a long way to promote health and wellness. Talk with your health care provider about what schedule of regular examinations is right for you. This is a good chance for you to check in with your provider about disease prevention and staying healthy. In between checkups, there are plenty of things you can do on your own. Experts have done a lot of research about which lifestyle changes and preventive measures are most likely to keep you healthy. Ask your health care provider for more information. Weight and diet Eat a healthy diet  Be sure to include plenty of vegetables, fruits, low-fat dairy products, and lean protein.  Do not eat a lot of foods high in solid fats, added sugars, or salt.  Get regular exercise. This is one of the most important things you can do for your health. ? Most adults should exercise for at least 150 minutes each week. The exercise should increase your heart rate and make you sweat (moderate-intensity exercise). ? Most adults should also do strengthening exercises at least twice a week. This is in addition to the moderate-intensity exercise.  Maintain a healthy weight  Body mass index (BMI) is a measurement that can be used to identify possible weight problems. It estimates body fat based on height and weight. Your health care provider can help determine your BMI and help you achieve or maintain a healthy weight.  For females 59 years of age and older: ? A BMI below 18.5 is considered underweight. ? A BMI of 18.5 to 24.9 is normal. ? A BMI of 25 to 29.9 is considered overweight. ? A BMI of 30 and above is considered  obese.  Watch levels of cholesterol and blood lipids  You should start having your blood tested for lipids and cholesterol at 70 years of age, then have this test every 5 years.  You may need to have your cholesterol levels checked more often if: ? Your lipid or cholesterol levels are high. ? You are older than 70 years of age. ? You are at high risk for heart disease.  Cancer screening Lung Cancer  Lung cancer screening is recommended for adults 73-25 years old who are at high risk for lung cancer because of a history of smoking.  A yearly low-dose CT scan of the lungs is recommended for people who: ? Currently smoke. ? Have quit within the past 15 years. ? Have at least a 30-pack-year history of smoking. A pack year is smoking an average of one pack of cigarettes a day for 1 year.  Yearly screening should continue until it has been 15 years since you quit.  Yearly screening should stop if you develop a health problem that would prevent you from having lung cancer treatment.  Breast Cancer  Practice breast self-awareness. This means understanding how your breasts normally appear and feel.  It also means doing regular breast self-exams. Let your health care provider know about any changes, no matter how small.  If you are in your 20s or 30s, you should have a clinical breast exam (CBE) by a health care provider every 1-3 years as part of a regular health exam.  If you are 29 or older, have a CBE every year. Also consider having a breast X-ray (mammogram) every year.  If you have a family history of breast cancer, talk to your health care provider about genetic screening.  If you are at high risk for breast cancer,  talk to your health care provider about having an MRI and a mammogram every year.  Breast cancer gene (BRCA) assessment is recommended for women who have family members with BRCA-related cancers. BRCA-related cancers  include: ? Breast. ? Ovarian. ? Tubal. ? Peritoneal cancers.  Results of the assessment will determine the need for genetic counseling and BRCA1 and BRCA2 testing.  Cervical Cancer Your health care provider may recommend that you be screened regularly for cancer of the pelvic organs (ovaries, uterus, and vagina). This screening involves a pelvic examination, including checking for microscopic changes to the surface of your cervix (Pap test). You may be encouraged to have this screening done every 3 years, beginning at age 95.  For women ages 68-65, health care providers may recommend pelvic exams and Pap testing every 3 years, or they may recommend the Pap and pelvic exam, combined with testing for human papilloma virus (HPV), every 5 years. Some types of HPV increase your risk of cervical cancer. Testing for HPV may also be done on women of any age with unclear Pap test results.  Other health care providers may not recommend any screening for nonpregnant women who are considered low risk for pelvic cancer and who do not have symptoms. Ask your health care provider if a screening pelvic exam is right for you.  If you have had past treatment for cervical cancer or a condition that could lead to cancer, you need Pap tests and screening for cancer for at least 20 years after your treatment. If Pap tests have been discontinued, your risk factors (such as having a new sexual partner) need to be reassessed to determine if screening should resume. Some women have medical problems that increase the chance of getting cervical cancer. In these cases, your health care provider may recommend more frequent screening and Pap tests.  Colorectal Cancer  This type of cancer can be detected and often prevented.  Routine colorectal cancer screening usually begins at 70 years of age and continues through 70 years of age.  Your health care provider may recommend screening at an earlier age if you have risk factors  for colon cancer.  Your health care provider may also recommend using home test kits to check for hidden blood in the stool.  A small camera at the end of a tube can be used to examine your colon directly (sigmoidoscopy or colonoscopy). This is done to check for the earliest forms of colorectal cancer.  Routine screening usually begins at age 5.  Direct examination of the colon should be repeated every 5-10 years through 70 years of age. However, you may need to be screened more often if early forms of precancerous polyps or small growths are found.  Skin Cancer  Check your skin from head to toe regularly.  Tell your health care provider about any new moles or changes in moles, especially if there is a change in a mole's shape or color.  Also tell your health care provider if you have a mole that is larger than the size of a pencil eraser.  Always use sunscreen. Apply sunscreen liberally and repeatedly throughout the day.  Protect yourself by wearing long sleeves, pants, a wide-brimmed hat, and sunglasses whenever you are outside.  Heart disease, diabetes, and high blood pressure  High blood pressure causes heart disease and increases the risk of stroke. High blood pressure is more likely to develop in: ? People who have blood pressure in the high end of the normal range (  130-139/85-89 mm Hg). ? People who are overweight or obese. ? People who are African American.  If you are 7-8 years of age, have your blood pressure checked every 3-5 years. If you are 59 years of age or older, have your blood pressure checked every year. You should have your blood pressure measured twice-once when you are at a hospital or clinic, and once when you are not at a hospital or clinic. Record the average of the two measurements. To check your blood pressure when you are not at a hospital or clinic, you can use: ? An automated blood pressure machine at a pharmacy. ? A home blood pressure monitor.  If  you are between 40 years and 53 years old, ask your health care provider if you should take aspirin to prevent strokes.  Have regular diabetes screenings. This involves taking a blood sample to check your fasting blood sugar level. ? If you are at a normal weight and have a low risk for diabetes, have this test once every three years after 70 years of age. ? If you are overweight and have a high risk for diabetes, consider being tested at a younger age or more often. Preventing infection Hepatitis B  If you have a higher risk for hepatitis B, you should be screened for this virus. You are considered at high risk for hepatitis B if: ? You were born in a country where hepatitis B is common. Ask your health care provider which countries are considered high risk. ? Your parents were born in a high-risk country, and you have not been immunized against hepatitis B (hepatitis B vaccine). ? You have HIV or AIDS. ? You use needles to inject street drugs. ? You live with someone who has hepatitis B. ? You have had sex with someone who has hepatitis B. ? You get hemodialysis treatment. ? You take certain medicines for conditions, including cancer, organ transplantation, and autoimmune conditions.  Hepatitis C  Blood testing is recommended for: ? Everyone born from [birth year] through 1963. ? Anyone with known risk factors for hepatitis C.  Sexually transmitted infections (STIs)  You should be screened for sexually transmitted infections (STIs) including gonorrhea and chlamydia if: ? You are sexually active and are younger than 70 years of age. ? You are older than 70 years of age and your health care provider tells you that you are at risk for this type of infection. ? Your sexual activity has changed since you were last screened and you are at an increased risk for chlamydia or gonorrhea. Ask your health care provider if you are at risk.  If you do not have HIV, but are at risk, it may be recommended  that you take a prescription medicine daily to prevent HIV infection. This is called pre-exposure prophylaxis (PrEP). You are considered at risk if: ? You are sexually active and do not regularly use condoms or know the HIV status of your partner(s). ? You take drugs by injection. ? You are sexually active with a partner who has HIV.  Talk with your health care provider about whether you are at high risk of being infected with HIV. If you choose to begin PrEP, you should first be tested for HIV. You should then be tested every 3 months for as long as you are taking PrEP. Pregnancy  If you are premenopausal and you may become pregnant, ask your health care provider about preconception counseling.  If you may become pregnant, take 400  to 800 micrograms (mcg) of folic acid every day.  If you want to prevent pregnancy, talk to your health care provider about birth control (contraception). Osteoporosis and menopause  Osteoporosis is a disease in which the bones lose minerals and strength with aging. This can result in serious bone fractures. Your risk for osteoporosis can be identified using a bone density scan.  If you are 18 years of age or older, or if you are at risk for osteoporosis and fractures, ask your health care provider if you should be screened.  Ask your health care provider whether you should take a calcium or vitamin D supplement to lower your risk for osteoporosis.  Menopause may have certain physical symptoms and risks.  Hormone replacement therapy may reduce some of these symptoms and risks. Talk to your health care provider about whether hormone replacement therapy is right for you. Follow these instructions at home:  Schedule regular health, dental, and eye exams.  Stay current with your immunizations.  Do not use any tobacco products including cigarettes, chewing tobacco, or electronic cigarettes.  If you are pregnant, do not drink alcohol.  If you are  breastfeeding, limit how much and how often you drink alcohol.  Limit alcohol intake to no more than 1 drink per day for nonpregnant women. One drink equals 12 ounces of beer, 5 ounces of wine, or 1 ounces of hard liquor.  Do not use street drugs.  Do not share needles.  Ask your health care provider for help if you need support or information about quitting drugs.  Tell your health care provider if you often feel depressed.  Tell your health care provider if you have ever been abused or do not feel safe at home. This information is not intended to replace advice given to you by your health care provider. Make sure you discuss any questions you have with your health care provider. Document Released: 05/09/2011 Document Revised: 03/31/2016 Document Reviewed: 07/28/2015 Elsevier Interactive Patient Education  [employer].

## 2018-09-24 ENCOUNTER — Other Ambulatory Visit: Payer: Self-pay

## 2018-09-25 ENCOUNTER — Ambulatory Visit (INDEPENDENT_AMBULATORY_CARE_PROVIDER_SITE_OTHER): Payer: Medicare Other | Admitting: Adult Health

## 2018-09-25 ENCOUNTER — Encounter: Payer: Self-pay | Admitting: Adult Health

## 2018-09-25 VITALS — BP 128/78 | Temp 98.0°F | Wt 229.0 lb

## 2018-09-25 DIAGNOSIS — Z6841 Body Mass Index (BMI) 40.0 and over, adult: Secondary | ICD-10-CM | POA: Diagnosis not present

## 2018-09-25 DIAGNOSIS — E785 Hyperlipidemia, unspecified: Secondary | ICD-10-CM

## 2018-09-25 LAB — CBC WITH DIFFERENTIAL/PLATELET
Basophils Absolute: 0 10*3/uL (ref 0.0–0.1)
Basophils Relative: 0.7 % (ref 0.0–3.0)
EOS ABS: 0.2 10*3/uL (ref 0.0–0.7)
EOS PCT: 3.5 % (ref 0.0–5.0)
HCT: 31.2 % — ABNORMAL LOW (ref 36.0–46.0)
HEMOGLOBIN: 10.5 g/dL — AB (ref 12.0–15.0)
Lymphocytes Relative: 46.8 % — ABNORMAL HIGH (ref 12.0–46.0)
Lymphs Abs: 2.7 10*3/uL (ref 0.7–4.0)
MCHC: 33.5 g/dL (ref 30.0–36.0)
MCV: 92.3 fl (ref 78.0–100.0)
MONO ABS: 0.3 10*3/uL (ref 0.1–1.0)
Monocytes Relative: 5.8 % (ref 3.0–12.0)
Neutro Abs: 2.5 10*3/uL (ref 1.4–7.7)
Neutrophils Relative %: 43.2 % (ref 43.0–77.0)
Platelets: 216 10*3/uL (ref 150.0–400.0)
RBC: 3.38 Mil/uL — ABNORMAL LOW (ref 3.87–5.11)
RDW: 13.7 % (ref 11.5–15.5)
WBC: 5.8 10*3/uL (ref 4.0–10.5)

## 2018-09-25 LAB — LIPID PANEL
CHOLESTEROL: 174 mg/dL (ref 0–200)
HDL: 50.9 mg/dL (ref >39.00)
LDL Cholesterol: 90 mg/dL (ref 0–99)
NonHDL: 122.79
Total CHOL/HDL Ratio: 3
Triglycerides: 165 mg/dL — ABNORMAL HIGH (ref 0.0–149.0)
VLDL: 33 mg/dL (ref 0.0–40.0)

## 2018-09-25 LAB — COMPREHENSIVE METABOLIC PANEL
ALBUMIN: 4.5 g/dL (ref 3.5–5.2)
ALK PHOS: 81 U/L (ref 39–117)
ALT: 13 U/L (ref 0–35)
AST: 14 U/L (ref 0–37)
BUN: 18 mg/dL (ref 6–23)
CO2: 25 mEq/L (ref 19–32)
CREATININE: 0.87 mg/dL (ref 0.40–1.20)
Calcium: 10.3 mg/dL (ref 8.4–10.5)
Chloride: 108 mEq/L (ref 96–112)
GFR: 82.8 mL/min (ref >60.00)
Glucose, Bld: 85 mg/dL (ref 70–99)
Potassium: 3.5 mEq/L (ref 3.5–5.1)
SODIUM: 142 meq/L (ref 135–145)
Total Bilirubin: 0.4 mg/dL (ref 0.2–1.2)
Total Protein: 7.8 g/dL (ref 6.0–8.3)

## 2018-09-25 LAB — TSH: TSH: 0.54 u[IU]/mL (ref 0.35–4.50)

## 2018-09-25 NOTE — Progress Notes (Signed)
Subjective:    Patient ID: Carla Nash, female    DOB: July 23, 1946, 72 y.o.   MRN: [id number]  HPI Patient presents for yearly preventative medicine examination. She is a pleasant 72 year old female who  has a past medical history of Arthritis, Asthma, Borderline diabetes, Cataract, Diverticulosis, DVT of axillary vein, chronic (HCC) (2001), Glaucoma, History of DVT of lower extremity (1990s), Obesity, and Pneumonia.  Osteoarthritis- worse in bilateral knees. Walks with a walker at home. Does not have much difficulty with stairs. Feels as though her arthritic pain has improved with weight loss and exercise.   Obesity - has put herself on a strict diet. Has been able to lose significant weight. Was 288 lbs two years ago. She has been walking when the weather is nice and she doesn't have too much knee pain  Wt Readings from Last 3 Encounters:  09/25/18 229 lb (103.9 kg)  08/29/18 226 lb (102.5 kg)  08/29/18 226 lb (102.5 kg)   Hyperlipidemia - not currently on medication. She has been able to bring her lipids levels down through diet and exercise  All immunizations and health maintenance protocols were reviewed with the patient and needed orders were placed. utd  Appropriate screening laboratory values were ordered for the patient including screening of hyperlipidemia, renal function and hepatic function.  Medication reconciliation,  past medical history, social history, problem list and allergies were reviewed in detail with the patient  Goals were established with regard to weight loss, exercise, and  diet in compliance with medications  End of life planning was discussed.  She is utd on routine screening exams such as vision screens, mammogram, and colonoscopy.   She has no acute complaints.    Review of Systems  Constitutional: Negative.   HENT: Negative.   Eyes: Negative.   Respiratory: Negative.   Cardiovascular: Negative.   Gastrointestinal: Negative.   Endocrine:  Negative.   Genitourinary: Negative.   Musculoskeletal: Positive for arthralgias, back pain and gait problem.  Skin: Negative.   Allergic/Immunologic: Negative.   Hematological: Negative.   Psychiatric/Behavioral: Negative.    Past Medical History:  Diagnosis Date  . Arthritis   . Asthma   . Borderline diabetes   . Cataract    both eyes  . Diverticulosis   . 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 Glaucoma   . History of DVT of lower extremity 1990s  . Obesity   . Pneumonia    as child    Social History   Socioeconomic History  . Marital status: Divorced    Spouse name: Not on file  . Number of children: 0  . Years of education: Not on file  . Highest education level: Not on file  Occupational History  . Occupation: Retired  Engineer, production  . Financial resource strain: Not on file  . Food insecurity:    Worry: Not on file    Inability: Not on file  . Transportation needs:    Medical: Not on file    Non-medical: Not on file  Tobacco Use  . Smoking status: Never Smoker  . Smokeless tobacco: Never Used  Substance and Sexual Activity  . Alcohol use: No  . Drug use: No  . Sexual activity: Not on file  Lifestyle  . Physical activity:    Days per week: Not on file    Minutes per session: Not on file  . Stress: Not on file  Relationships  . Social  connections:    Talks on phone: Not on file    Gets together: Not on file    Attends religious service: Not on file    Active member of club or organization: Not on file    Attends meetings of clubs or organizations: Not on file    Relationship status: Not on file  . Intimate partner violence:    Fear of current or ex partner: Not on file    Emotionally abused: Not on file    Physically abused: Not on file    Forced sexual activity: Not on file  Other Topics Concern  . Not on file  Social History Narrative   Divorced for 16 years    No children    Lives with Niece    Regular exercise: no    Caffeine Use:  1-2 drinks daily             Past Surgical History:  Procedure Laterality Date  . ABDOMINAL HYSTERECTOMY  2006   total  . breast biopsy 2008 Right    benign  . CATARACT EXTRACTION     07/05/17 (left eye) and 07/26/17 (right eye)  . COLONOSCOPY WITH PROPOFOL N/A 03/30/2017   Procedure: COLONOSCOPY WITH PROPOFOL;  Surgeon: Rachael FeeJacobs, Daniel P, MD;  Location: [facility];  Service: Endoscopy;  Laterality: N/A;  . colonscopy  2008  . OOPHORECTOMY     bilateral  . SUPRANUMERARY NIPPLE EXCISION     right breast    Family History  Problem Relation Age of Onset  . Heart disease Mother        CHF  . Arthritis Mother   . Diabetes Mother   . COPD Father   . Cancer Father        lung  . Kidney disease Sister        kidney failure    Allergies  Allergen Reactions  . Penicillins Shortness Of Breath, Itching and Rash    Has patient had a PCN reaction causing immediate rash, facial/tongue/throat swelling, SOB or lightheadedness with hypotension: Yes Has patient had a PCN reaction causing severe rash involving mucus membranes or skin necrosis: Unknown Has patient had a PCN reaction that required hospitalization: No Has patient had a PCN reaction occurring within the last 10 years: No If all of the above answers are "NO", then may proceed with Cephalosporin use.    Current Outpatient Medications on File Prior to Visit  Medication Sig Dispense Refill  . acetaminophen (TYLENOL) 500 MG tablet Take 1 tablet (500 mg total) by mouth 3 (three) times daily. (Patient taking differently: Take 500 mg by mouth every 4 (four) hours as needed for mild pain. ) 90 tablet prn  . Cholecalciferol (VITAMIN D3 PO) Take 1 tablet by mouth daily.     Current Facility-Administered Medications on File Prior to Visit  Medication Dose Route Frequency Provider Last Rate Last Dose  . methylPREDNISolone acetate (DEPO-MEDROL) injection 80 mg  80 mg Intra-articular Once Mariska Daffin, NP      .  methylPREDNISolone acetate (DEPO-MEDROL) injection 80 mg  80 mg Intra-articular Once Addy Mcmannis, NP        BP 128/78   Temp 98 F (36.7 C)   Wt 229 lb (103.9 kg)   BMI 46.25 kg/m       Objective:   Physical Exam  Constitutional: She is oriented to person, place, and time. She appears well-developed and well-nourished. No distress.  Obese   HENT:  Head: Normocephalic and atraumatic.  Right Ear: External ear normal.  Left Ear: External ear normal.  Nose: Nose normal.  Mouth/Throat: Oropharynx is clear and moist. No oropharyngeal exudate.  Eyes: Pupils are equal, round, and reactive to light. Conjunctivae and EOM are normal. Right eye exhibits no discharge. Left eye exhibits no discharge. No scleral icterus.  Neck: Normal range of motion. Neck supple. No JVD present. No tracheal deviation present. No thyromegaly present.  Cardiovascular: Normal rate, regular rhythm, normal heart sounds and intact distal pulses. Exam reveals no gallop and no friction rub.  No murmur heard. Pulmonary/Chest: Effort normal and breath sounds normal. No stridor. No respiratory distress. She has no wheezes. She has no rales. She exhibits no tenderness.  Abdominal: Soft. Bowel sounds are normal. She exhibits no distension and no mass. There is no tenderness. There is no rebound and no guarding. No hernia.  Genitourinary:  Genitourinary Comments: Does not want breast exam    Musculoskeletal: Normal range of motion. She exhibits no edema, tenderness or deformity.  Walks with four prong cane. Has slow steady gait   Lymphadenopathy:    She has no cervical adenopathy.  Neurological: She is alert and oriented to person, place, and time. She displays normal reflexes. No cranial nerve deficit or sensory deficit. She exhibits normal muscle tone. Coordination normal.  Skin: Skin is warm and dry. Capillary refill takes less than 2 seconds. No rash noted. She is not diaphoretic. No erythema. No pallor.    Psychiatric: She has a normal mood and affect. Her behavior is normal. Judgment and thought content normal.  Nursing note and vitals reviewed.     Assessment & Plan:  1. Elevated lipids - Consider statin  - Encouraged to continue to eat healthy and exercise  - Follow up in one year or sooner if needed - CBC with Differential/Platelet - Comprehensive metabolic panel - Lipid panel - TSH  2. Class 3 severe obesity due to excess calories without serious comorbidity with body mass index (BMI) of 40.0 to 44.9 in adult [facility]) - She is doing well with weight loss. Up three pounds over the last month but endorse " cheating" a little bit.  - CBC with Differential/Platelet - Comprehensive metabolic panel - Lipid panel - TSH  Shirline Frees, NP

## 2019-09-17 DIAGNOSIS — Z01419 Encounter for gynecological examination (general) (routine) without abnormal findings: Secondary | ICD-10-CM | POA: Diagnosis not present

## 2019-09-17 DIAGNOSIS — Z1231 Encounter for screening mammogram for malignant neoplasm of breast: Secondary | ICD-10-CM | POA: Diagnosis not present

## 2019-09-26 ENCOUNTER — Other Ambulatory Visit: Payer: Self-pay

## 2019-09-27 ENCOUNTER — Other Ambulatory Visit: Payer: Self-pay | Admitting: Adult Health

## 2019-09-27 ENCOUNTER — Encounter: Payer: Self-pay | Admitting: Adult Health

## 2019-09-27 ENCOUNTER — Ambulatory Visit (INDEPENDENT_AMBULATORY_CARE_PROVIDER_SITE_OTHER): Payer: Medicare Other | Admitting: Adult Health

## 2019-09-27 VITALS — BP 120/70 | Temp 97.8°F | Wt 228.0 lb

## 2019-09-27 DIAGNOSIS — M8949 Other hypertrophic osteoarthropathy, multiple sites: Secondary | ICD-10-CM

## 2019-09-27 DIAGNOSIS — R7989 Other specified abnormal findings of blood chemistry: Secondary | ICD-10-CM

## 2019-09-27 DIAGNOSIS — Z6841 Body Mass Index (BMI) 40.0 and over, adult: Secondary | ICD-10-CM

## 2019-09-27 DIAGNOSIS — E785 Hyperlipidemia, unspecified: Secondary | ICD-10-CM | POA: Diagnosis not present

## 2019-09-27 DIAGNOSIS — Z23 Encounter for immunization: Secondary | ICD-10-CM

## 2019-09-27 DIAGNOSIS — M159 Polyosteoarthritis, unspecified: Secondary | ICD-10-CM

## 2019-09-27 LAB — CBC WITH DIFFERENTIAL/PLATELET
Basophils Absolute: 0 10*3/uL (ref 0.0–0.1)
Basophils Relative: 0.6 % (ref 0.0–3.0)
Eosinophils Absolute: 0.3 10*3/uL (ref 0.0–0.7)
Eosinophils Relative: 3.8 % (ref 0.0–5.0)
HCT: 33.5 % — ABNORMAL LOW (ref 36.0–46.0)
Hemoglobin: 10.9 g/dL — ABNORMAL LOW (ref 12.0–15.0)
Lymphocytes Relative: 50.9 % — ABNORMAL HIGH (ref 12.0–46.0)
Lymphs Abs: 3.5 10*3/uL (ref 0.7–4.0)
MCHC: 32.6 g/dL (ref 30.0–36.0)
MCV: 89 fl (ref 78.0–100.0)
Monocytes Absolute: 0.3 10*3/uL (ref 0.1–1.0)
Monocytes Relative: 5 % (ref 3.0–12.0)
Neutro Abs: 2.7 10*3/uL (ref 1.4–7.7)
Neutrophils Relative %: 39.7 % — ABNORMAL LOW (ref 43.0–77.0)
Platelets: 258 10*3/uL (ref 150.0–400.0)
RBC: 3.77 Mil/uL — ABNORMAL LOW (ref 3.87–5.11)
RDW: 14.4 % (ref 11.5–15.5)
WBC: 6.8 10*3/uL (ref 4.0–10.5)

## 2019-09-27 LAB — COMPREHENSIVE METABOLIC PANEL
ALT: 7 U/L (ref 0–35)
AST: 13 U/L (ref 0–37)
Albumin: 4.3 g/dL (ref 3.5–5.2)
Alkaline Phosphatase: 87 U/L (ref 39–117)
BUN: 9 mg/dL (ref 6–23)
CO2: 24 mEq/L (ref 19–32)
Calcium: 10.2 mg/dL (ref 8.4–10.5)
Chloride: 107 mEq/L (ref 96–112)
Creatinine, Ser: 0.59 mg/dL (ref 0.40–1.20)
GFR: 121.6 mL/min (ref >60.00)
Glucose, Bld: 82 mg/dL (ref 70–99)
Potassium: 4 mEq/L (ref 3.5–5.1)
Sodium: 141 mEq/L (ref 135–145)
Total Bilirubin: 0.4 mg/dL (ref 0.2–1.2)
Total Protein: 7.9 g/dL (ref 6.0–8.3)

## 2019-09-27 LAB — LIPID PANEL
Cholesterol: 174 mg/dL (ref 0–200)
HDL: 43.9 mg/dL (ref >39.00)
LDL Cholesterol: 97 mg/dL (ref 0–99)
NonHDL: 130.06
Total CHOL/HDL Ratio: 4
Triglycerides: 165 mg/dL — ABNORMAL HIGH (ref 0.0–149.0)
VLDL: 33 mg/dL (ref 0.0–40.0)

## 2019-09-27 LAB — TSH: TSH: 0.15 u[IU]/mL — ABNORMAL LOW (ref 0.35–4.50)

## 2019-09-27 NOTE — Progress Notes (Signed)
Subjective:    Patient ID: Carla Nash, female    DOB: 12[name]/15/1947, [age] y.o.   MRN: [id number]  HPI Patient presents for yearly preventative medicine examination. She is a pleasant 73 year old female who  has a past medical history of Arthritis, Asthma, Borderline diabetes, Cataract, Diverticulosis, DVT of axillary vein, chronic (HCC) (2001), Glaucoma, History of DVT of lower extremity (1990s), Obesity, and Pneumonia.  H/o Hyperlipidemia -she is not currently prescribed medications.  She has been able to bring her lipid levels down through diet and exercise Lab Results  Component Value Date   CHOL 174 09/25/2018   HDL 50.90 09/25/2018   LDLCALC 90 09/25/2018   LDLDIRECT 172.9 02/21/2012   TRIG 165.0 (H) 09/25/2018   CHOLHDL 3 09/25/2018    Obesity - she continues to stay active and is eating healthy. She has been able to maintain her weight over the last year.  Wt Readings from Last 10 Encounters:  09/27/19 228 lb (103.4 kg)  09/25/18 229 lb (103.9 kg)  08/29/18 226 lb (102.5 kg)  08/29/18 226 lb (102.5 kg)  01/30/18 213 lb (96.6 kg)  01/15/18 215 lb (97.5 kg)  09/21/17 226 lb (102.5 kg)  03/30/17 259 lb (117.5 kg)  03/15/17 259 lb (117.5 kg)  02/14/17 262 lb 6.4 oz (119 kg)    Osteoarthritis - has chronic pain in her knees and low back. She takes tylenol as needed. She does not want anything else done.   All immunizations and health maintenance protocols were reviewed with the patient and needed orders were placed. She is due for a flu shot   Appropriate screening laboratory values were ordered for the patient including screening of hyperlipidemia, renal function and hepatic function.  Medication reconciliation,  past medical history, social history, problem list and allergies were reviewed in detail with the patient  Goals were established with regard to weight loss, exercise, and  diet in compliance with medications. She has been exercising and eating healthy.   End  of life planning was discussed.  She is up-to-date on routine screening colonoscopy.  She is due for mammogram  Review of Systems  Constitutional: Negative.   HENT: Negative.   Eyes: Negative.   Respiratory: Negative.   Cardiovascular: Negative.   Gastrointestinal: Negative.   Endocrine: Negative.   Genitourinary: Negative.   Musculoskeletal: Positive for arthralgias and gait problem.  Skin: Negative.   Allergic/Immunologic: Negative.   Hematological: Negative.   Psychiatric/Behavioral: Negative.    Past Medical History:  Diagnosis Date  . Arthritis   . Asthma   . Borderline diabetes   . Cataract    both eyes  . Diverticulosis   . 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 Glaucoma   . History of DVT of lower extremity 1990s  . Obesity   . Pneumonia    as child    Social History   Socioeconomic History  . Marital status: Divorced    Spouse name: Not on file  . Number of children: 0  . Years of education: Not on file  . Highest education level: Not on file  Occupational History  . Occupation: Retired  Engineer, productionocial Needs  . Financial resource strain: Not on file  . Food insecurity    Worry: Not on file    Inability: Not on file  . Transportation needs    Medical: Not on file    Non-medical: Not on file  Tobacco Use  . Smoking  status: Never Smoker  . Smokeless tobacco: Never Used  Substance and Sexual Activity  . Alcohol use: No  . Drug use: No  . Sexual activity: Not on file  Lifestyle  . Physical activity    Days per week: Not on file    Minutes per session: Not on file  . Stress: Not on file  Relationships  . Social Herbalist on phone: Not on file    Gets together: Not on file    Attends religious service: Not on file    Active member of club or organization: Not on file    Attends meetings of clubs or organizations: Not on file    Relationship status: Not on file  . Intimate partner violence    Fear of current or ex  partner: Not on file    Emotionally abused: Not on file    Physically abused: Not on file    Forced sexual activity: Not on file  Other Topics Concern  . Not on file  Social History Narrative   Divorced for 16 years    No children    Lives with Niece    Regular exercise: no   Caffeine Use:  1-2 drinks daily             Past Surgical History:  Procedure Laterality Date  . ABDOMINAL HYSTERECTOMY  2006   total  . breast biopsy 2008 Right    benign  . CATARACT EXTRACTION     07/05/17 (left eye) and 07/26/17 (right eye)  . COLONOSCOPY WITH PROPOFOL N/A 03/30/2017   Procedure: COLONOSCOPY WITH PROPOFOL;  Surgeon: Milus Banister, MD;  Location: [facility];  Service: Endoscopy;  Laterality: N/A;  . colonscopy  2008  . OOPHORECTOMY     bilateral  . SUPRANUMERARY NIPPLE EXCISION     right breast    Family History  Problem Relation Age of Onset  . Heart disease Mother        CHF  . Arthritis Mother   . Diabetes Mother   . COPD Father   . Cancer Father        lung  . Kidney disease Sister        kidney failure    Allergies  Allergen Reactions  . Penicillins Shortness Of Breath, Itching and Rash    Has patient had a PCN reaction causing immediate rash, facial/tongue/throat swelling, SOB or lightheadedness with hypotension: Yes Has patient had a PCN reaction causing severe rash involving mucus membranes or skin necrosis: Unknown Has patient had a PCN reaction that required hospitalization: No Has patient had a PCN reaction occurring within the last 10 years: No If all of the above answers are "NO", then may proceed with Cephalosporin use.    Current Outpatient Medications on File Prior to Visit  Medication Sig Dispense Refill  . acetaminophen (TYLENOL) 500 MG tablet Take 1 tablet (500 mg total) by mouth 3 (three) times daily. (Patient taking differently: Take 500 mg by mouth every 4 (four) hours as needed for mild pain. ) 90 tablet prn   No current  facility-administered medications on file prior to visit.     BP 120/70   Temp 97.8 F (36.6 C) (Temporal)   Wt 228 lb (103.4 kg)   BMI 46.05 kg/m       Objective:   Physical Exam Vitals signs and nursing note reviewed.  Constitutional:      Appearance: Normal appearance. She is obese.  HENT:  Right Ear: Tympanic membrane, ear canal and external ear normal. There is no impacted cerumen.     Left Ear: Tympanic membrane, ear canal and external ear normal. There is no impacted cerumen.     Nose: Nose normal. No congestion or rhinorrhea.     Mouth/Throat:     Mouth: Mucous membranes are moist.     Pharynx: Oropharynx is clear. No oropharyngeal exudate or posterior oropharyngeal erythema.  Eyes:     Extraocular Movements: Extraocular movements intact.     Pupils: Pupils are equal, round, and reactive to light.  Cardiovascular:     Rate and Rhythm: Normal rate and regular rhythm.     Pulses: Normal pulses.     Heart sounds: Normal heart sounds. No murmur. No friction rub. No gallop.   Pulmonary:     Effort: Pulmonary effort is normal. No respiratory distress.     Breath sounds: Normal breath sounds. No stridor. No wheezing, rhonchi or rales.  Chest:     Chest wall: No tenderness.  Abdominal:     General: Abdomen is flat. Bowel sounds are normal. There is no distension.     Palpations: Abdomen is soft. There is no mass.     Tenderness: There is no abdominal tenderness. There is no right CVA tenderness, guarding or rebound.     Hernia: No hernia is present.  Musculoskeletal: Normal range of motion.        General: No swelling, tenderness, deformity or signs of injury.     Right lower leg: No edema.     Left lower leg: No edema.  Skin:    General: Skin is warm.     Capillary Refill: Capillary refill takes less than 2 seconds.     Coloration: Skin is not jaundiced or pale.     Findings: No bruising, erythema, lesion or rash.  Neurological:     General: No focal deficit  present.     Mental Status: She is alert and oriented to person, place, and time.     Gait: Gait abnormal.     Comments: Walks with a cane   Psychiatric:        Mood and Affect: Mood normal.        Behavior: Behavior normal.        Thought Content: Thought content normal.        Judgment: Judgment normal.       Assessment & Plan:  1. Elevated lipids - Consider statin  - Continue to execise and eat healthy  - CBC with Differential/Platelet - CMP - Lipid panel - TSH  2. Class 3 severe obesity due to excess calories without serious comorbidity with body mass index (BMI) of 40.0 to 44.9 in adult [facility]) - Been able to maintain weight  - Continue to exercise and eat healthy  - Follow up in one year or sooner if needed - CBC with Differential/Platelet - CMP - Lipid panel - TSH  3. Primary osteoarthritis involving multiple joints - Continue with tylenol PRN    This visit occurred during the SARS-CoV-2 public health emergency.  Safety protocols were in place, including screening questions prior to the visit, additional usage of staff PPE, and extensive cleaning of exam room while observing appropriate contact time as indicated for disinfecting solutions.   Shirline Frees, NP

## 2019-09-27 NOTE — Patient Instructions (Signed)
It was great seeing you today.   Continue to stay active and exercise  We will follow up with you regarding your blood work   Please follow up with me in one year   Have a very happy birthday and happy holidays.

## 2019-10-14 ENCOUNTER — Other Ambulatory Visit (INDEPENDENT_AMBULATORY_CARE_PROVIDER_SITE_OTHER): Payer: Medicare Other

## 2019-10-14 ENCOUNTER — Other Ambulatory Visit: Payer: Self-pay

## 2019-10-14 DIAGNOSIS — R7989 Other specified abnormal findings of blood chemistry: Secondary | ICD-10-CM | POA: Diagnosis not present

## 2019-10-14 LAB — T3, FREE: T3, Free: 3 pg/mL (ref 2.3–4.2)

## 2019-10-14 LAB — TSH: TSH: 0.18 u[IU]/mL — ABNORMAL LOW (ref 0.35–4.50)

## 2019-10-14 LAB — T4, FREE: Free T4: 0.43 ng/dL — ABNORMAL LOW (ref 0.60–1.60)

## 2019-11-07 ENCOUNTER — Telehealth: Payer: Self-pay | Admitting: *Deleted

## 2019-11-07 NOTE — Telephone Encounter (Signed)
Copied from [employer] [id number]. Topic: Quick Communication - See Telephone Encounter >> Nov 07, 2019 10:15 AM Loma Boston wrote: CRM for notification. See Telephone encounter for: 11/07/19.Newman Nickels (Patient) Newman Nickels (Patient) Medicare AWV Reason for CRM: Left message for patient to call back and schedule Medicare Annual Wellness Visit (AWV) either virtually,audio only or in person (whichever the patient prefers--45 MINUTES).  Pt called back to sch, office states does not sch and [facility] is not sch AWV Office requesting a CRM for resh/ FU with pt From health coach

## 2019-11-19 ENCOUNTER — Ambulatory Visit: Payer: Medicare Other

## 2019-11-19 ENCOUNTER — Telehealth: Payer: Self-pay | Admitting: *Deleted

## 2019-11-19 ENCOUNTER — Other Ambulatory Visit: Payer: Self-pay

## 2019-11-19 NOTE — Telephone Encounter (Signed)
Patient need to schedule an ov for more refills. 

## 2019-11-19 NOTE — Telephone Encounter (Signed)
Spoke to pt and advised that her appt was canceled due to an emergency. Pt verbalized understanding. No further action needed.

## 2019-11-19 NOTE — Telephone Encounter (Signed)
Copied from CRM [id number]. Topic: General - Other >> Nov 19, 2019  1:20 PM Mcneil, Ja-Kwan wrote: Reason for CRM: Pt called in very upset that she came in for her appt and had to wait for about 1 hour and 45 minutes (per the patient) just to be told that the appt had to be rescheduled. Pt would like a call back to verify that she will be able to see a provider on 11/27/19 even if it is not the provider that the appt is scheduled with. Pt very upset that her time was wasted

## 2019-11-27 ENCOUNTER — Ambulatory Visit: Payer: Medicare Other

## 2019-12-03 ENCOUNTER — Other Ambulatory Visit: Payer: Self-pay

## 2019-12-03 ENCOUNTER — Ambulatory Visit (INDEPENDENT_AMBULATORY_CARE_PROVIDER_SITE_OTHER): Payer: Medicare Other

## 2019-12-03 VITALS — BP 118/78 | Ht <= 58 in | Wt 231.4 lb

## 2019-12-03 DIAGNOSIS — Z780 Asymptomatic menopausal state: Secondary | ICD-10-CM

## 2019-12-03 DIAGNOSIS — Z13820 Encounter for screening for osteoporosis: Secondary | ICD-10-CM

## 2019-12-03 DIAGNOSIS — Z0000 Encounter for general adult medical examination without abnormal findings: Secondary | ICD-10-CM | POA: Diagnosis not present

## 2019-12-03 NOTE — Progress Notes (Signed)
Subjective:   Carla Nash is a 74 y.o. female who presents for Medicare Annual (Subsequent) preventive examination.  Carla Nash has no complaints today. She ambulates very slowly with a quad cane. She continues to live with niece and nephew. She does not drive. She reports that she exercises 2 days a week at home. She also reports that she struggles with snacking in between meals.   Review of Systems:  NO ROS; Annual Medicare Wellness Visit Cardiac Risk Factors include: advanced age (>11men, >33 women);obesity (BMI >30kg/m2);sedentary lifestyle     Objective:     Vitals: BP 118/78 (BP Location: Left Arm, Patient Position: Sitting)   Ht 4\' 10"  (1.473 m)   Wt 231 lb 6.4 oz (105 kg)   BMI 48.36 kg/m   Body mass index is 48.36 kg/m.  Advanced Directives 12/03/2019 08/29/2018 03/30/2017 03/28/2017  Does Patient Have a Medical Advance Directive? Yes Yes Yes Yes  Type of Advance Directive Living will - [place];Living will [place];Living will  Does patient want to make changes to medical advance directive? No - Patient declined - - No - Patient declined  Copy of [place] in Chart? - - No - copy requested No - copy requested    Tobacco Social History   Tobacco Use  Smoking Status Never Smoker  Smokeless Tobacco Never Used     Counseling given: Not Answered   Clinical Intake:  Pre-visit preparation completed: Yes  Pain : No/denies pain     BMI - recorded: 48.36 Nutritional Status: BMI > 30  Obese Nutritional Risks: Unintentional weight gain Diabetes: No  How often do you need to have someone help you when you read instructions, pamphlets, or other written materials from your doctor or pharmacy?: 1 - Never What is the last grade level you completed in school?: 12th  Interpreter Needed?: No  Information entered by :: Franne Forts, LPN  Past Medical History:  Diagnosis Date  . Arthritis   . Asthma     . Borderline diabetes   . Cataract    both eyes  . Diverticulosis   . 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 Glaucoma   . History of DVT of lower extremity 1990s  . Obesity   . Pneumonia    as child   Past Surgical History:  Procedure Laterality Date  . ABDOMINAL HYSTERECTOMY  2006   total  . breast biopsy 2008 Right    benign  . CATARACT EXTRACTION     07/05/17 (left eye) and 07/26/17 (right eye)  . COLONOSCOPY WITH PROPOFOL N/A 03/30/2017   Procedure: COLONOSCOPY WITH PROPOFOL;  Surgeon: Milus Banister, MD;  Location: [facility];  Service: Endoscopy;  Laterality: N/A;  . colonscopy  2008  . OOPHORECTOMY     bilateral  . SUPRANUMERARY NIPPLE EXCISION     right breast   Family History  Problem Relation Age of Onset  . Heart disease Mother        CHF  . Arthritis Mother   . Diabetes Mother   . COPD Father   . Cancer Father        lung  . Kidney disease Sister        kidney failure   Social History   Socioeconomic History  . Marital status: Divorced    Spouse name: Not on file  . Number of children: 0  . Years of education: 29  .  Highest education level: High school graduate  Occupational History  . Occupation: Retired  Tobacco Use  . Smoking status: Never Smoker  . Smokeless tobacco: Never Used  Substance and Sexual Activity  . Alcohol use: No  . Drug use: No  . Sexual activity: Not on file  Other Topics Concern  . Not on file  Social History Narrative   Divorced for 16 years    No children    Lives with Niece & nephew   Regular exercise: no   Caffeine Use:  1-2 drinks daily            Social Determinants of Health   Financial Resource Strain: Low Risk   . Difficulty of Paying Living Expenses: Not very hard  Food Insecurity: No Food Insecurity  . Worried About Programme researcher, broadcasting/film/video in the Last Year: Never true  . Ran Out of Food in the Last Year: Never true  Transportation Needs: No Transportation Needs  . Lack of  Transportation (Medical): No  . Lack of Transportation (Non-Medical): No  Physical Activity: Insufficiently Active  . Days of Exercise per Week: 2 days  . Minutes of Exercise per Session: 30 min  Stress: No Stress Concern Present  . Feeling of Stress : Only a little  Social Connections: Slightly Isolated  . Frequency of Communication with Friends and Family: More than three times a week  . Frequency of Social Gatherings with Friends and Family: Once a week  . Attends Religious Services: More than 4 times per year  . Active Member of Clubs or Organizations: Yes  . Attends Banker Meetings: 1 to 4 times per year  . Marital Status: Divorced    Outpatient Encounter Medications as of 12/03/2019  Medication Sig  . acetaminophen (TYLENOL) 500 MG tablet Take 1 tablet (500 mg total) by mouth 3 (three) times daily. (Patient taking differently: Take 500 mg by mouth every 4 (four) hours as needed for mild pain. )   No facility-administered encounter medications on file as of 12/03/2019.    Activities of Daily Living In your present state of health, do you have any difficulty performing the following activities: 12/03/2019  Hearing? N  Vision? N  Difficulty concentrating or making decisions? N  Walking or climbing stairs? Y  Dressing or bathing? N  Doing errands, shopping? Y  Preparing Food and eating ? N  Using the Toilet? N  In the past six months, have you accidently leaked urine? N  Do you have problems with loss of bowel control? N  Managing your Medications? N  Managing your Finances? N  Housekeeping or managing your Housekeeping? N  Some recent data might be hidden    Patient Care Team: Shirline Frees, NP as PCP - General (Family Medicine) Carrington Clamp, MD as Consulting Physician (Obstetrics and Gynecology)    Assessment:   This is a routine wellness examination for Carla Nash.  Exercise Activities and Dietary recommendations Current Exercise Habits: The patient  does not participate in regular exercise at present, Exercise limited by: Other - see comments(obesity causes very slow ambulation)  Goals    . Cut out extra servings    . DIET - INCREASE WATER INTAKE     Drink 6 bottles of water every day    . Exercise 150 min/wk Moderate Activity     Continue to walk as much as you can     . Increase physical activity       Fall Risk Fall Risk  12/03/2019 09/24/2018 08/29/2018 08/29/2018 09/21/2017  Falls in the past year? 0 0 No No No  Comment - Emmi Telephone Survey: data to providers prior to load - - -  Number falls in past yr: 0 - - - -  Risk for fall due to : Medication side effect - - - -  Follow up Falls evaluation completed;Education provided;Falls prevention discussed - - - -   Is the patient's home free of loose throw rugs in walkways, pet beds, electrical cords, etc?   yes      Grab bars in the bathroom? no      Handrails on the stairs?   yes      Adequate lighting?   yes  Timed Get Up and Go performed: Ambulation is slow but steady gait with use of quad cane.  Depression Screen PHQ 2/9 Scores 12/03/2019 08/29/2018 08/29/2018 09/21/2017  PHQ - 2 Score 0 0 0 0     Cognitive Function MMSE - Mini Mental State Exam 08/29/2018  Not completed: (No Data)     6CIT Screen 12/03/2019  What Year? 0 points  What month? 0 points  What time? 0 points  Count back from 20 0 points  Months in reverse 0 points  Repeat phrase 0 points  Total Score 0    Immunization History  Administered Date(s) Administered  . Fluad Quad(high Dose 65+) 09/27/2019  . Influenza Split 10/20/2011  . Influenza Whole 09/07/2010  . Influenza, High Dose Seasonal PF 07/21/2015, 09/16/2016, 09/21/2017, 08/29/2018  . Influenza-Unspecified 08/10/2015  . Pneumococcal Conjugate-13 07/21/2015  . Pneumococcal Polysaccharide-23 03/16/2012, 09/21/2017  . Td 12/09/2008  . Tdap 01/07/2014  . Zoster 03/16/2012    Qualifies for Shingles Vaccine?yes  Screening  Tests Health Maintenance  Topic Date Due  . Hepatitis C Screening  December 24, 1945  . MAMMOGRAM  09/16/2021  . TETANUS/TDAP  01/08/2024  . COLONOSCOPY  03/31/2027  . INFLUENZA VACCINE  Completed  . DEXA SCAN  Completed  . PNA vac Low Risk Adult  Completed    Cancer Screenings: Lung: Low Dose CT Chest recommended if Age 71-80 years, 30 pack-year currently smoking OR have quit w/in 15years. Patient does not qualify. Breast:  Up to date on Mammogram? Yes   Up to date of Bone Density/Dexa? Yes Colorectal: yes  Additional Screenings:  Hepatitis C Screening: defer to next routine lab collection in office.     Plan:   Ms. Depass will check with a pharmacy for out of pocket costs for shingrix vaccines. She does plan on taking covid vaccines as well. Hep C screening needs to be collected at next routine lab draw in office. Order was sent today for DEXA due in March 2021. Discussion about importance of increasing physical activity and decreasing extra food portions and decreasing snacking. She is agreeable to increasing water intake as well.    I have personally reviewed and noted the following in the patient's chart:   . Medical and social history . Use of alcohol, tobacco or illicit drugs  . Current medications and supplements . Functional ability and status . Nutritional status . Physical activity . Advanced directives . List of other physicians . Hospitalizations, surgeries, and ER visits in previous 12 months . Vitals . Screenings to include cognitive, depression, and falls . Referrals and appointments  In addition, I have reviewed and discussed with patient certain preventive protocols, quality metrics, and best practice recommendations. A written personalized care plan for preventive services as well as general preventive health recommendations  were provided to patient.     Nathaniel Man, LPN  7/34/2876

## 2019-12-03 NOTE — Patient Instructions (Addendum)
Carla Nash , Thank you for taking time to come for your Medicare Wellness Visit. I appreciate your ongoing commitment to your health goals. Please review the following plan we discussed and let me know if I can assist you in the future.   Screening recommendations/referrals: Colorectal Screening: colonoscopy completed 03/30/2017. Up to date.  Mammogram: completed 09/17/2019; due again 09/16/2020. Bone Density: completed 01/23/2018; due again 01/24/2020.  Vision and Dental Exams: Recommended annual ophthalmology exams for early detection of glaucoma and other disorders of the eye Recommended annual dental exams for proper oral hygiene  Diabetic Exams: Diabetic Eye Exam: N/A Diabetic Foot Exam: N/A  Vaccinations: Influenza vaccine: completed 09/27/2019; due again Fall 2021. Pneumococcal vaccine: completed 07/21/2015 & 09/21/2017. Up to date.  Tdap vaccine: completed 01/07/2014; due again 01/08/2024. Shingles vaccine: Please call your pharmacy to determine your out of pocket expense for the Shingrix vaccine. You may receive this vaccine at your local pharmacy. This is a series of two vaccines to be given 2-6 months apart.   Advanced directives: Advance directives discussed with you today. Please bring a copy of your POA (Power of [place]) and/or Living Will to your next appointment.  Goals: Recommend to drink at least 6-8 8oz glasses of water per day.  Recommend to exercise for at least 150 minutes per week.  Recommend to remove any items from the home that may cause slips or trips.  OR  Recommend to decrease portion sizes by eating 3 small healthy meals and at least 2 healthy snacks per day.  Next appointment: Please schedule your Annual Wellness Visit with your Nurse Health Advisor in one year.  Preventive Care 62 Years and Older, Female Preventive care refers to lifestyle choices and visits with your health care provider that can promote health and wellness. What does  preventive care include?  A yearly physical exam. This is also called an annual well check.  Dental exams once or twice a year.  Routine eye exams. Ask your health care provider how often you should have your eyes checked.  Personal lifestyle choices, including:  Daily care of your teeth and gums.  Regular physical activity.  Eating a healthy diet.  Avoiding tobacco and drug use.  Limiting alcohol use.  Practicing safe sex.  Taking low-dose aspirin every day if recommended by your health care provider.  Taking vitamin and mineral supplements as recommended by your health care provider. What happens during an annual well check? The services and screenings done by your health care provider during your annual well check will depend on your age, overall health, lifestyle risk factors, and family history of disease. Counseling  Your health care provider may ask you questions about your:  Alcohol use.  Tobacco use.  Drug use.  Emotional well-being.  Home and relationship well-being.  Sexual activity.  Eating habits.  History of falls.  Memory and ability to understand (cognition).  Work and work Statistician.  Reproductive health. Screening  You may have the following tests or measurements:  Height, weight, and BMI.  Blood pressure.  Lipid and cholesterol levels. These may be checked every 5 years, or more frequently if you are over 47 years old.  Skin check.  Lung cancer screening. You may have this screening every year starting at age 66 if you have a 30-pack-year history of smoking and currently smoke or have quit within the past 15 years.  Fecal occult blood test (FOBT) of the stool. You may have this test every year starting at age  50.  Flexible sigmoidoscopy or colonoscopy. You may have a sigmoidoscopy every 5 years or a colonoscopy every 10 years starting at age 78.  Hepatitis C blood test.  Hepatitis B blood test.  Sexually transmitted disease  (STD) testing.  Diabetes screening. This is done by checking your blood sugar (glucose) after you have not eaten for a while (fasting). You may have this done every 1-3 years.  Bone density scan. This is done to screen for osteoporosis. You may have this done starting at age 72.  Mammogram. This may be done every 1-2 years. Talk to your health care provider about how often you should have regular mammograms. Talk with your health care provider about your test results, treatment options, and if necessary, the need for more tests. Vaccines  Your health care provider may recommend certain vaccines, such as:  Influenza vaccine. This is recommended every year.  Tetanus, diphtheria, and acellular pertussis (Tdap, Td) vaccine. You may need a Td booster every 10 years.  Zoster vaccine. You may need this after age 67.  Pneumococcal 13-valent conjugate (PCV13) vaccine. One dose is recommended after age 87.  Pneumococcal polysaccharide (PPSV23) vaccine. One dose is recommended after age 50. Talk to your health care provider about which screenings and vaccines you need and how often you need them. This information is not intended to replace advice given to you by your health care provider. Make sure you discuss any questions you have with your health care provider. Document Released: 11/20/2015 Document Revised: 07/13/2016 Document Reviewed: 08/25/2015 Elsevier Interactive Patient Education  2017 [employer].  Fall Prevention in the Home Falls can cause injuries. They can happen to people of all ages. There are many things you can do to make your home safe and to help prevent falls. What can I do on the outside of my home?  Regularly fix the edges of walkways and driveways and fix any cracks.  Remove anything that might make you trip as you walk through a door, such as a raised step or threshold.  Trim any bushes or trees on the path to your home.  Use bright outdoor lighting.  Clear any  walking paths of anything that might make someone trip, such as rocks or tools.  Regularly check to see if handrails are loose or broken. Make sure that both sides of any steps have handrails.  Any raised decks and porches should have guardrails on the edges.  Have any leaves, snow, or ice cleared regularly.  Use sand or salt on walking paths during winter.  Clean up any spills in your garage right away. This includes oil or grease spills. What can I do in the bathroom?  Use night lights.  Install grab bars by the toilet and in the tub and shower. Do not use towel bars as grab bars.  Use non-skid mats or decals in the tub or shower.  If you need to sit down in the shower, use a plastic, non-slip stool.  Keep the floor dry. Clean up any water that spills on the floor as soon as it happens.  Remove soap buildup in the tub or shower regularly.  Attach bath mats securely with double-sided non-slip rug tape.  Do not have throw rugs and other things on the floor that can make you trip. What can I do in the bedroom?  Use night lights.  Make sure that you have a light by your bed that is easy to reach.  Do not use any sheets  or blankets that are too big for your bed. They should not hang down onto the floor.  Have a firm chair that has side arms. You can use this for support while you get dressed.  Do not have throw rugs and other things on the floor that can make you trip. What can I do in the kitchen?  Clean up any spills right away.  Avoid walking on wet floors.  Keep items that you use a lot in easy-to-reach places.  If you need to reach something above you, use a strong step stool that has a grab bar.  Keep electrical cords out of the way.  Do not use floor polish or wax that makes floors slippery. If you must use wax, use non-skid floor wax.  Do not have throw rugs and other things on the floor that can make you trip. What can I do with my stairs?  Do not leave  any items on the stairs.  Make sure that there are handrails on both sides of the stairs and use them. Fix handrails that are broken or loose. Make sure that handrails are as long as the stairways.  Check any carpeting to make sure that it is firmly attached to the stairs. Fix any carpet that is loose or worn.  Avoid having throw rugs at the top or bottom of the stairs. If you do have throw rugs, attach them to the floor with carpet tape.  Make sure that you have a light switch at the top of the stairs and the bottom of the stairs. If you do not have them, ask someone to add them for you. What else can I do to help prevent falls?  Wear shoes that:  Do not have high heels.  Have rubber bottoms.  Are comfortable and fit you well.  Are closed at the toe. Do not wear sandals.  If you use a stepladder:  Make sure that it is fully opened. Do not climb a closed stepladder.  Make sure that both sides of the stepladder are locked into place.  Ask someone to hold it for you, if possible.  Clearly mark and make sure that you can see:  Any grab bars or handrails.  First and last steps.  Where the edge of each step is.  Use tools that help you move around (mobility aids) if they are needed. These include:  Canes.  Walkers.  Scooters.  Crutches.  Turn on the lights when you go into a dark area. Replace any light bulbs as soon as they burn out.  Set up your furniture so you have a clear path. Avoid moving your furniture around.  If any of your floors are uneven, fix them.  If there are any pets around you, be aware of where they are.  Review your medicines with your doctor. Some medicines can make you feel dizzy. This can increase your chance of falling. Ask your doctor what other things that you can do to help prevent falls. This information is not intended to replace advice given to you by your health care provider. Make sure you discuss any questions you have with your  health care provider. Document Released: 08/20/2009 Document Revised: 03/31/2016 Document Reviewed: 11/28/2014 Elsevier Interactive Patient Education  2017 [employer].

## 2020-04-02 DIAGNOSIS — Z23 Encounter for immunization: Secondary | ICD-10-CM | POA: Diagnosis not present

## 2020-09-17 ENCOUNTER — Other Ambulatory Visit: Payer: Self-pay

## 2020-09-17 ENCOUNTER — Encounter: Payer: Self-pay | Admitting: Adult Health

## 2020-09-17 ENCOUNTER — Encounter (INDEPENDENT_AMBULATORY_CARE_PROVIDER_SITE_OTHER): Payer: Medicare Other | Admitting: Adult Health

## 2020-09-17 VITALS — BP 112/80 | HR 68 | Temp 97.8°F | Ht <= 58 in | Wt 225.2 lb

## 2020-09-17 DIAGNOSIS — Z23 Encounter for immunization: Secondary | ICD-10-CM

## 2020-09-23 DIAGNOSIS — Z1231 Encounter for screening mammogram for malignant neoplasm of breast: Secondary | ICD-10-CM | POA: Diagnosis not present

## 2020-10-15 ENCOUNTER — Ambulatory Visit (INDEPENDENT_AMBULATORY_CARE_PROVIDER_SITE_OTHER): Payer: Medicare Other | Admitting: Adult Health

## 2020-10-15 ENCOUNTER — Encounter: Payer: Self-pay | Admitting: Adult Health

## 2020-10-15 ENCOUNTER — Other Ambulatory Visit: Payer: Self-pay

## 2020-10-15 VITALS — BP 118/70 | HR 71 | Temp 97.9°F | Ht <= 58 in | Wt 224.2 lb

## 2020-10-15 DIAGNOSIS — M8949 Other hypertrophic osteoarthropathy, multiple sites: Secondary | ICD-10-CM | POA: Diagnosis not present

## 2020-10-15 DIAGNOSIS — Z6841 Body Mass Index (BMI) 40.0 and over, adult: Secondary | ICD-10-CM

## 2020-10-15 DIAGNOSIS — E785 Hyperlipidemia, unspecified: Secondary | ICD-10-CM | POA: Diagnosis not present

## 2020-10-15 DIAGNOSIS — M159 Polyosteoarthritis, unspecified: Secondary | ICD-10-CM

## 2020-10-15 NOTE — Progress Notes (Signed)
Subjective:    Patient ID: Carla Nash, female    DOB: 06/29/1946, 75 y.o.   MRN: [id number]  HPI Patient presents for yearly preventative medicine examination. 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   H/o of hyperlipidemia -not currently on any medication.  She has been able to bring her lipid levels down through extensive weight loss with diet and exercise Lab Results  Component Value Date   CHOL 174 09/27/2019   HDL 43.90 09/27/2019   LDLCALC 97 09/27/2019   LDLDIRECT 172.9 02/21/2012   TRIG 165.0 (H) 09/27/2019   CHOLHDL 4 09/27/2019   Obesity -she continues to stay active and is eating healthy.  Her weight is down another 4 pounds over the last year and a total of 38 pounds at her heaviest  Osteoarthritis -chronic pain in her knees and low back.  She does take Tylenol as needed.  Does not want to have anything else done  All immunizations and health maintenance protocols were reviewed with the patient and needed orders were placed. All vaccinations are up to date   Appropriate screening laboratory values were ordered for the patient including screening of hyperlipidemia, renal function and hepatic function.   Medication reconciliation,  past medical history, social history, problem list and allergies were reviewed in detail with the patient  Goals were established with regard to weight loss, exercise, and  diet in compliance with medications  End of life planning was discussed.  She is up to date on her mammogram and colon cancer screening     Review of Systems  Constitutional: Negative.   HENT: Negative.   Eyes: Negative.   Respiratory: Negative.   Cardiovascular: Negative.   Gastrointestinal: Negative.   Endocrine: Negative.   Genitourinary:  Negative.   Musculoskeletal: Positive for arthralgias, back pain and gait problem.  Skin: Negative.   Allergic/Immunologic: Negative.   Hematological: Negative.   Psychiatric/Behavioral: Negative.    Past Medical History:  Diagnosis Date  . Arthritis   . Asthma   . Borderline diabetes   . Cataract    both eyes  . Diverticulosis   . 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 Glaucoma   . History of DVT of lower extremity 1990s  . Obesity   . Pneumonia    as child    Social History   Socioeconomic History  . Marital status: Divorced    Spouse name: Not on file  . Number of children: 0  . Years of education: 3  . Highest education level: High school graduate  Occupational History  . Occupation: Retired  Tobacco Use  . Smoking status: Never Smoker  . Smokeless tobacco: Never Used  Vaping Use  . Vaping Use: Never used  Substance and Sexual Activity  . Alcohol use: No  . Drug use: No  . Sexual activity: Not on file  Other Topics Concern  . Not on file  Social History Narrative   Divorced for 16 years    No children    Lives with Niece & nephew   Regular exercise: no   Caffeine Use:  1-2 drinks daily            Social Determinants of Health   Financial Resource Strain: Low Risk   . Difficulty  of Paying Living Expenses: Not very hard  Food Insecurity: No Food Insecurity  . Worried About Charity fundraiser in the Last Year: Never true  . Ran Out of Food in the Last Year: Never true  Transportation Needs: No Transportation Needs  . Lack of Transportation (Medical): No  . Lack of Transportation (Non-Medical): No  Physical Activity: Insufficiently Active  . Days of Exercise per Week: 2 days  . Minutes of Exercise per Session: 30 min  Stress: No Stress Concern Present  . Feeling of Stress : Only a little  Social Connections: Moderately Integrated  . Frequency of Communication with Friends and Family: More than three times a week  . Frequency  of Social Gatherings with Friends and Family: Once a week  . Attends Religious Services: More than 4 times per year  . Active Member of Clubs or Organizations: Yes  . Attends Archivist Meetings: 1 to 4 times per year  . Marital Status: Divorced  Human resources officer Violence: Not on file    Past Surgical History:  Procedure Laterality Date  . ABDOMINAL HYSTERECTOMY  2006   total  . breast biopsy 2008 Right    benign  . CATARACT EXTRACTION     07/05/17 (left eye) and 07/26/17 (right eye)  . COLONOSCOPY WITH PROPOFOL N/A 03/30/2017   Procedure: COLONOSCOPY WITH PROPOFOL;  Surgeon: Milus Banister, MD;  Location: [facility];  Service: Endoscopy;  Laterality: N/A;  . colonscopy  2008  . OOPHORECTOMY     bilateral  . SUPRANUMERARY NIPPLE EXCISION     right breast    Family History  Problem Relation Age of Onset  . Heart disease Mother        CHF  . Arthritis Mother   . Diabetes Mother   . COPD Father   . Cancer Father        lung  . Kidney disease Sister        kidney failure    Allergies  Allergen Reactions  . Penicillins Shortness Of Breath, Itching and Rash    Has patient had a PCN reaction causing immediate rash, facial/tongue/throat swelling, SOB or lightheadedness with hypotension: Yes Has patient had a PCN reaction causing severe rash involving mucus membranes or skin necrosis: Unknown Has patient had a PCN reaction that required hospitalization: No Has patient had a PCN reaction occurring within the last 10 years: No If all of the above answers are "NO", then may proceed with Cephalosporin use.  . Shingrix [Zoster Vac Recomb Adjuvanted] Itching    Severe per patient    Current Outpatient Medications on File Prior to Visit  Medication Sig Dispense Refill  . acetaminophen (TYLENOL) 500 MG tablet Take 1 tablet (500 mg total) by mouth 3 (three) times daily. (Patient taking differently: Take 500 mg by mouth every 4 (four) hours as needed for mild pain.) 90  tablet prn   No current facility-administered medications on file prior to visit.    BP 118/70   Pulse 71   Temp 97.9 F (36.6 C) (Oral)   Ht 4' 10" (1.473 m)   Wt 224 lb 3.2 oz (101.7 kg)   BMI 46.86 kg/m       Objective:   Physical Exam Vitals and nursing note reviewed.  Constitutional:      General: She is not in acute distress.    Appearance: Normal appearance. She is well-developed. She is not ill-appearing.  HENT:     Head: Normocephalic and atraumatic.  Right Ear: Tympanic membrane, ear canal and external ear normal. There is no impacted cerumen.     Left Ear: Tympanic membrane, ear canal and external ear normal. There is no impacted cerumen.     Nose: Nose normal. No congestion or rhinorrhea.     Mouth/Throat:     Mouth: Mucous membranes are moist.     Pharynx: Oropharynx is clear. No oropharyngeal exudate or posterior oropharyngeal erythema.  Eyes:     General:        Right eye: No discharge.        Left eye: No discharge.     Extraocular Movements: Extraocular movements intact.     Conjunctiva/sclera: Conjunctivae normal.     Pupils: Pupils are equal, round, and reactive to light.  Neck:     Thyroid: No thyromegaly.     Vascular: No carotid bruit.     Trachea: No tracheal deviation.  Cardiovascular:     Rate and Rhythm: Normal rate and regular rhythm.     Pulses: Normal pulses.     Heart sounds: Normal heart sounds. No murmur heard. No friction rub. No gallop.   Pulmonary:     Effort: Pulmonary effort is normal. No respiratory distress.     Breath sounds: Normal breath sounds. No stridor. No wheezing, rhonchi or rales.  Chest:     Chest wall: No tenderness.  Abdominal:     General: Abdomen is flat. Bowel sounds are normal. There is no distension.     Palpations: Abdomen is soft. There is no mass.     Tenderness: There is no abdominal tenderness. There is no right CVA tenderness, left CVA tenderness, guarding or rebound.     Hernia: No hernia is  present.  Musculoskeletal:        General: No swelling, tenderness, deformity or signs of injury. Normal range of motion.     Cervical back: Normal range of motion and neck supple.     Right lower leg: No edema.     Left lower leg: No edema.  Lymphadenopathy:     Cervical: No cervical adenopathy.  Skin:    General: Skin is warm and dry.     Coloration: Skin is not jaundiced or pale.     Findings: No bruising, erythema, lesion or rash.  Neurological:     General: No focal deficit present.     Mental Status: She is alert and oriented to person, place, and time.     Cranial Nerves: No cranial nerve deficit.     Sensory: No sensory deficit.     Motor: No weakness.     Coordination: Coordination normal.     Gait: Gait normal.     Deep Tendon Reflexes: Reflexes normal.  Psychiatric:        Mood and Affect: Mood normal.        Behavior: Behavior normal.        Thought Content: Thought content normal.        Judgment: Judgment normal.       Assessment & Plan:  1. Class 3 severe obesity due to excess calories without serious comorbidity with body mass index (BMI) of 40.0 to 44.9 in adult [facility]) - Continue to work on weight loss through diet and exercise - CBC with Differential/Platelet; Future - TSH; Future - Lipid panel; Future - CMP with eGFR([facility]); Future - CBC with Differential/Platelet - TSH - Lipid panel - CMP with eGFR([facility])  2. Elevated lipids - likely not needing statin therapy -  CBC with Differential/Platelet; Future - TSH; Future - Lipid panel; Future - CMP with eGFR([facility]); Future - CBC with Differential/Platelet - TSH - Lipid panel - CMP with eGFR([facility])  3. Primary osteoarthritis involving multiple joints - Continue with tylenol PRN    Dorothyann Peng, NP

## 2020-10-15 NOTE — Patient Instructions (Signed)
It was great seeing you today!   We will follow up with you regarding your blood work   Keep up with the weight loss- you are doing great

## 2020-10-16 LAB — CBC WITH DIFFERENTIAL/PLATELET
Absolute Monocytes: 302 cells/uL (ref 200–950)
Basophils Absolute: 63 cells/uL (ref 0–200)
Basophils Relative: 1 %
Eosinophils Absolute: 214 cells/uL (ref 15–500)
Eosinophils Relative: 3.4 %
HCT: 36.5 % (ref 35.0–45.0)
Hemoglobin: 12 g/dL (ref 11.7–15.5)
Lymphs Abs: 3560 cells/uL (ref 850–3900)
MCH: 29.9 pg (ref 27.0–33.0)
MCHC: 32.9 g/dL (ref 32.0–36.0)
MCV: 90.8 fL (ref 80.0–100.0)
MPV: 10.9 fL (ref 7.5–12.5)
Monocytes Relative: 4.8 %
Neutro Abs: 2161 cells/uL (ref 1500–7800)
Neutrophils Relative %: 34.3 %
Platelets: 254 10*3/uL (ref 140–400)
RBC: 4.02 10*6/uL (ref 3.80–5.10)
RDW: 13.8 % (ref 11.0–15.0)
Total Lymphocyte: 56.5 %
WBC: 6.3 10*3/uL (ref 3.8–10.8)

## 2020-10-16 LAB — COMPLETE METABOLIC PANEL WITH GFR
AG Ratio: 1.1 (calc) (ref 1.0–2.5)
ALT: 20 U/L (ref 6–29)
AST: 23 U/L (ref 10–35)
Albumin: 4.7 g/dL (ref 3.6–5.1)
Alkaline phosphatase (APISO): 85 U/L (ref 37–153)
BUN: 13 mg/dL (ref 7–25)
CO2: 25 mmol/L (ref 20–32)
Calcium: 10.3 mg/dL (ref 8.6–10.4)
Chloride: 103 mmol/L (ref 98–110)
Creat: 0.75 mg/dL (ref 0.60–0.93)
GFR, Est African American: 93 mL/min/{1.73_m2} (ref >=60)
GFR, Est Non African American: 80 mL/min/{1.73_m2} (ref >=60)
Globulin: 4.3 g/dL (calc) — ABNORMAL HIGH (ref 1.9–3.7)
Glucose, Bld: 90 mg/dL (ref 65–99)
Potassium: 3.8 mmol/L (ref 3.5–5.3)
Sodium: 139 mmol/L (ref 135–146)
Total Bilirubin: 0.5 mg/dL (ref 0.2–1.2)
Total Protein: 9 g/dL — ABNORMAL HIGH (ref 6.1–8.1)

## 2020-10-16 LAB — LIPID PANEL
Cholesterol: 227 mg/dL — ABNORMAL HIGH (ref <200)
HDL: 49 mg/dL — ABNORMAL LOW (ref >=50)
LDL Cholesterol (Calc): 150 mg/dL (calc) — ABNORMAL HIGH
Non-HDL Cholesterol (Calc): 178 mg/dL (calc) — ABNORMAL HIGH (ref <130)
Total CHOL/HDL Ratio: 4.6 (calc) (ref <5.0)
Triglycerides: 149 mg/dL (ref <150)

## 2020-10-16 LAB — TSH: TSH: 1.12 mIU/L (ref 0.40–4.50)

## 2020-10-17 DIAGNOSIS — Z23 Encounter for immunization: Secondary | ICD-10-CM | POA: Diagnosis not present

## 2020-12-03 ENCOUNTER — Ambulatory Visit: Payer: Self-pay

## 2021-10-05 DIAGNOSIS — Z01419 Encounter for gynecological examination (general) (routine) without abnormal findings: Secondary | ICD-10-CM | POA: Diagnosis not present

## 2021-10-12 ENCOUNTER — Encounter: Payer: Self-pay | Admitting: Adult Health

## 2021-10-19 ENCOUNTER — Ambulatory Visit (INDEPENDENT_AMBULATORY_CARE_PROVIDER_SITE_OTHER): Payer: Medicare Other | Admitting: Adult Health

## 2021-10-19 ENCOUNTER — Encounter: Payer: Self-pay | Admitting: Adult Health

## 2021-10-19 VITALS — BP 120/80 | HR 55 | Temp 97.0°F | Ht 59.0 in | Wt 199.0 lb

## 2021-10-19 DIAGNOSIS — Z23 Encounter for immunization: Secondary | ICD-10-CM

## 2021-10-19 DIAGNOSIS — Z6841 Body Mass Index (BMI) 40.0 and over, adult: Secondary | ICD-10-CM

## 2021-10-19 DIAGNOSIS — M159 Polyosteoarthritis, unspecified: Secondary | ICD-10-CM | POA: Diagnosis not present

## 2021-10-19 DIAGNOSIS — Z1159 Encounter for screening for other viral diseases: Secondary | ICD-10-CM | POA: Diagnosis not present

## 2021-10-19 DIAGNOSIS — E785 Hyperlipidemia, unspecified: Secondary | ICD-10-CM | POA: Diagnosis not present

## 2021-10-19 LAB — COMPREHENSIVE METABOLIC PANEL
ALT: 12 U/L (ref 0–35)
AST: 17 U/L (ref 0–37)
Albumin: 4.2 g/dL (ref 3.5–5.2)
Alkaline Phosphatase: 69 U/L (ref 39–117)
BUN: 15 mg/dL (ref 6–23)
CO2: 26 mEq/L (ref 19–32)
Calcium: 10.2 mg/dL (ref 8.4–10.5)
Chloride: 104 mEq/L (ref 96–112)
Creatinine, Ser: 0.59 mg/dL (ref 0.40–1.20)
GFR: 89.68 mL/min (ref >60.00)
Glucose, Bld: 76 mg/dL (ref 70–99)
Potassium: 3.9 mEq/L (ref 3.5–5.1)
Sodium: 138 mEq/L (ref 135–145)
Total Bilirubin: 0.3 mg/dL (ref 0.2–1.2)
Total Protein: 8.5 g/dL — ABNORMAL HIGH (ref 6.0–8.3)

## 2021-10-19 LAB — CBC WITH DIFFERENTIAL/PLATELET
Basophils Absolute: 0 10*3/uL (ref 0.0–0.1)
Basophils Relative: 0.5 % (ref 0.0–3.0)
Eosinophils Absolute: 0.2 10*3/uL (ref 0.0–0.7)
Eosinophils Relative: 4.1 % (ref 0.0–5.0)
HCT: 33.4 % — ABNORMAL LOW (ref 36.0–46.0)
Hemoglobin: 10.7 g/dL — ABNORMAL LOW (ref 12.0–15.0)
Lymphocytes Relative: 47.3 % — ABNORMAL HIGH (ref 12.0–46.0)
Lymphs Abs: 2.7 10*3/uL (ref 0.7–4.0)
MCHC: 32 g/dL (ref 30.0–36.0)
MCV: 89.7 fl (ref 78.0–100.0)
Monocytes Absolute: 0.3 10*3/uL (ref 0.1–1.0)
Monocytes Relative: 5.6 % (ref 3.0–12.0)
Neutro Abs: 2.4 10*3/uL (ref 1.4–7.7)
Neutrophils Relative %: 42.5 % — ABNORMAL LOW (ref 43.0–77.0)
Platelets: 248 10*3/uL (ref 150.0–400.0)
RBC: 3.73 Mil/uL — ABNORMAL LOW (ref 3.87–5.11)
RDW: 14.5 % (ref 11.5–15.5)
WBC: 5.6 10*3/uL (ref 4.0–10.5)

## 2021-10-19 LAB — LIPID PANEL
Cholesterol: 177 mg/dL (ref 0–200)
HDL: 49.3 mg/dL (ref >39.00)
LDL Cholesterol: 105 mg/dL — ABNORMAL HIGH (ref 0–99)
NonHDL: 127.63
Total CHOL/HDL Ratio: 4
Triglycerides: 112 mg/dL (ref 0.0–149.0)
VLDL: 22.4 mg/dL (ref 0.0–40.0)

## 2021-10-19 LAB — TSH: TSH: 0.43 u[IU]/mL (ref 0.35–5.50)

## 2021-10-19 NOTE — Patient Instructions (Signed)
It was great seeing you today! Congratulations on the 25 pound weight loss.   We will follow up with you regarding your lab work   Please let me know if you need anything   I will see you back in one year or sooner if needed

## 2021-10-19 NOTE — Progress Notes (Signed)
Subjective:    Patient ID: Carla Nash, female    DOB: 06-11-1946, 75 y.o.   MRN: [id number]  HPI Patient presents for yearly preventative medicine examination. She is a pleasant 75 year old female who  has a past medical history of Arthritis, Asthma, Borderline diabetes, Cataract, Diverticulosis, DVT of axillary vein, chronic ([facility]) (2001), Glaucoma, History of DVT of lower extremity (1990s), Obesity, and Pneumonia.  History of Hyperlipidemia -not currently on any medication. Lab Results  Component Value Date   CHOL 227 (H) 10/15/2020   HDL 49 (L) 10/15/2020   LDLCALC 150 (H) 10/15/2020   LDLDIRECT 172.9 02/21/2012   TRIG 149 10/15/2020   CHOLHDL 4.6 10/15/2020   Obesity - She has been working on weight loss through diet and and exercise. She has been able to lose about 25 pounds over the last year Wt Readings from Last 10 Encounters:  10/19/21 199 lb (90.3 kg)  10/15/20 224 lb 3.2 oz (101.7 kg)  09/17/20 225 lb 3.2 oz (102.2 kg)  12/03/19 231 lb 6.4 oz (105 kg)  09/27/19 228 lb (103.4 kg)  09/25/18 229 lb (103.9 kg)  08/29/18 226 lb (102.5 kg)  08/29/18 226 lb (102.5 kg)  01/30/18 213 lb (96.6 kg)  01/15/18 215 lb (97.5 kg)   Osteoarthritis -chronic pain in her knees and low back.  She does take Tylenol as needed.  Does not want anything else done at this time.  All immunizations and health maintenance protocols were reviewed with the patient and needed orders were placed.  Appropriate screening laboratory values were ordered for the patient including screening of hyperlipidemia, renal function and hepatic function.   Medication reconciliation,  past medical history, social history, problem list and allergies were reviewed in detail with the patient  Goals were established with regard to weight loss, exercise, and  diet in compliance with medications   Review of Systems  Constitutional: Negative.   HENT: Negative.    Eyes: Negative.   Respiratory: Negative.     Cardiovascular: Negative.   Gastrointestinal: Negative.   Endocrine: Negative.   Genitourinary: Negative.   Musculoskeletal:  Positive for arthralgias, back pain and gait problem.  Skin: Negative.   Allergic/Immunologic: Negative.   Hematological: Negative.   Psychiatric/Behavioral: Negative.     Past Medical History:  Diagnosis Date   Arthritis    Asthma    Borderline diabetes    Cataract    both eyes   Diverticulosis    DVT of axillary vein, chronic ([facility]) 2001   left leg    Glaucoma    History of DVT of lower extremity 1990s   Obesity    Pneumonia    as child    Social History   Socioeconomic History   Marital status: Divorced    Spouse name: Not on file   Number of children: 0   Years of education: 12   Highest education level: High school graduate  Occupational History   Occupation: Retired  Tobacco Use   Smoking status: Never   Smokeless tobacco: Never  Building services engineer Use: Never used  Substance and Sexual Activity   Alcohol use: No   Drug use: No   Sexual activity: Not on file  Other Topics Concern   Not on file  Social History Narrative   Divorced for 16 years    No children    Lives with Niece & nephew   Regular exercise: no   Caffeine Use:  1-2 drinks daily  Social Determinants of Health   Financial Resource Strain: Not on file  Food Insecurity: Not on file  Transportation Needs: Not on file  Physical Activity: Not on file  Stress: Not on file  Social Connections: Not on file  Intimate Partner Violence: Not on file    Past Surgical History:  Procedure Laterality Date   ABDOMINAL HYSTERECTOMY  2006   total   breast biopsy 2008 Right    benign   CATARACT EXTRACTION     07/05/17 (left eye) and 07/26/17 (right eye)   COLONOSCOPY WITH PROPOFOL N/A 03/30/2017   Procedure: COLONOSCOPY WITH PROPOFOL;  Surgeon: Rachael Fee, MD;  Location: [facility];  Service: Endoscopy;  Laterality: N/A;   colonscopy  2008    OOPHORECTOMY     bilateral   SUPRANUMERARY NIPPLE EXCISION     right breast    Family History  Problem Relation Age of Onset   Heart disease Mother        CHF   Arthritis Mother    Diabetes Mother    COPD Father    Cancer Father        lung   Kidney disease Sister        kidney failure    Allergies  Allergen Reactions   Penicillins Shortness Of Breath, Itching and Rash    Has patient had a PCN reaction causing immediate rash, facial/tongue/throat swelling, SOB or lightheadedness with hypotension: Yes Has patient had a PCN reaction causing severe rash involving mucus membranes or skin necrosis: Unknown Has patient had a PCN reaction that required hospitalization: No Has patient had a PCN reaction occurring within the last 10 years: No If all of the above answers are "NO", then may proceed with Cephalosporin use.   Shingrix [Zoster Vac Recomb Adjuvanted] Itching    Severe per patient    Current Outpatient Medications on File Prior to Visit  Medication Sig Dispense Refill   acetaminophen (TYLENOL) 500 MG tablet Take 1 tablet (500 mg total) by mouth 3 (three) times daily. (Patient taking differently: Take 500 mg by mouth every 4 (four) hours as needed for mild pain.) 90 tablet prn   No current facility-administered medications on file prior to visit.    BP 120/80   Pulse (!) 55   Temp (!) 97 F (36.1 C) (Temporal)   Ht 4\' 11"  (1.499 m)   Wt 199 lb (90.3 kg)   SpO2 95%   BMI 40.19 kg/m        Objective:   Physical Exam Vitals and nursing note reviewed.  Constitutional:      General: She is not in acute distress.    Appearance: Normal appearance. She is well-developed. She is not ill-appearing.  HENT:     Head: Normocephalic and atraumatic.     Right Ear: Tympanic membrane, ear canal and external ear normal. There is no impacted cerumen.     Left Ear: Tympanic membrane, ear canal and external ear normal. There is no impacted cerumen.     Nose: Nose normal. No  congestion or rhinorrhea.     Mouth/Throat:     Mouth: Mucous membranes are moist.     Pharynx: Oropharynx is clear. No oropharyngeal exudate or posterior oropharyngeal erythema.  Eyes:     General:        Right eye: No discharge.        Left eye: No discharge.     Extraocular Movements: Extraocular movements intact.     Conjunctiva/sclera: Conjunctivae normal.  Pupils: Pupils are equal, round, and reactive to light.  Neck:     Thyroid: No thyromegaly.     Vascular: No carotid bruit.     Trachea: No tracheal deviation.  Cardiovascular:     Rate and Rhythm: Normal rate and regular rhythm.     Pulses: Normal pulses.     Heart sounds: Normal heart sounds. No murmur heard.   No friction rub. No gallop.  Pulmonary:     Effort: Pulmonary effort is normal. No respiratory distress.     Breath sounds: Normal breath sounds. No stridor. No wheezing, rhonchi or rales.  Chest:     Chest wall: No tenderness.  Abdominal:     General: Abdomen is flat. Bowel sounds are normal. There is no distension.     Palpations: Abdomen is soft. There is no mass.     Tenderness: There is no abdominal tenderness. There is no right CVA tenderness, left CVA tenderness, guarding or rebound.     Hernia: No hernia is present.  Musculoskeletal:        General: No swelling, tenderness, deformity or signs of injury. Normal range of motion.     Cervical back: Normal range of motion and neck supple.     Right lower leg: No edema.     Left lower leg: No edema.  Lymphadenopathy:     Cervical: No cervical adenopathy.  Skin:    General: Skin is warm and dry.     Coloration: Skin is not jaundiced or pale.     Findings: No bruising, erythema, lesion or rash.  Neurological:     General: No focal deficit present.     Mental Status: She is alert and oriented to person, place, and time.     Cranial Nerves: No cranial nerve deficit.     Sensory: No sensory deficit.     Motor: No weakness.     Coordination:  Coordination normal.     Gait: Gait abnormal (walks with 4 prong cane. Has steady gait).     Deep Tendon Reflexes: Reflexes normal.  Psychiatric:        Mood and Affect: Mood normal.        Behavior: Behavior normal.        Thought Content: Thought content normal.        Judgment: Judgment normal.      Assessment & Plan:  1. Hyperlipidemia, unspecified hyperlipidemia type - Consider adding statin  - CBC with Differential/Platelet; Future - Comprehensive metabolic panel; Future - Lipid panel; Future - TSH; Future  2. Primary osteoarthritis involving multiple joints - Continue with tylenol as needed  3. Class 3 severe obesity due to excess calories without serious comorbidity with body mass index (BMI) of 40.0 to 44.9 in adult ([facility]) - 25 pounds over the last year.  - She is doing very well  - Keep up the good work  - CBC with Differential/Platelet; Future - Comprehensive metabolic panel; Future - Lipid panel; Future - TSH; Future  4. Need for hepatitis C screening test  - Hep C Antibody; Future  5. Need for immunization against influenza  - Flu Vaccine QUAD High Dose(Fluad)  Shirline Frees, NP

## 2021-10-20 LAB — HEPATITIS C ANTIBODY
Hepatitis C Ab: NONREACTIVE
SIGNAL TO CUT-OFF: 0.05 (ref <1.00)

## 2021-10-21 DIAGNOSIS — Z1231 Encounter for screening mammogram for malignant neoplasm of breast: Secondary | ICD-10-CM | POA: Diagnosis not present

## 2021-12-02 ENCOUNTER — Ambulatory Visit (INDEPENDENT_AMBULATORY_CARE_PROVIDER_SITE_OTHER): Payer: Medicare Other

## 2021-12-02 VITALS — Ht 59.0 in | Wt 199.0 lb

## 2021-12-02 DIAGNOSIS — Z0000 Encounter for general adult medical examination without abnormal findings: Secondary | ICD-10-CM | POA: Diagnosis not present

## 2021-12-02 NOTE — Progress Notes (Signed)
Subjective:   Carla Nash is a 76 y.o. female who presents for Medicare Annual (Subsequent) preventive examination.  Review of Systems    No ROS Cardiac Risk Factors include: advanced age (>[id number], 37>65 women);hypertension    Objective:    Today's Vitals   12/02/21 0945  Weight: 199 lb (90.3 kg)  Height: 4\' 11"  (1.499 m)   Body mass index is 40.19 kg/m.  Advanced Directives 12/02/2021 12/03/2019 08/29/2018 03/30/2017 03/28/2017  Does Patient Have a Medical Advance Directive? No Yes Yes Yes Yes  Type of Advance Directive - Living will - Healthcare Power of Rember[place]orney;Living will Healthcare Power of [place];Living will  Does patient want to make changes to medical advance directive? - No - Patient declined - - No - Patient declined  Copy of Healthcare Power of Attorney in Chart? - - - No - copy requested No - copy requested  Would patient like information on creating a medical advance directive? No - Patient declined - - - -    Current Medications (verified) Outpatient Encounter Medications as of 12/02/2021  Medication Sig   acetaminophen (TYLENOL) 500 MG tablet Take 1 tablet (500 mg total) by mouth 3 (three) times daily. (Patient taking differently: Take 500 mg by mouth every 4 (four) hours as needed for mild pain.)   No facility-administered encounter medications on file as of 12/02/2021.    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    Glaucoma    History of DVT of lower extremity 1990s   Obesity    Pneumonia    as child   Past Surgical History:  Procedure Laterality Date   ABDOMINAL HYSTERECTOMY  2006   total   breast biopsy 2008 Right    benign   CATARACT EXTRACTION     07/05/17 (left eye) and 07/26/17 (right eye)   COLONOSCOPY WITH  PROPOFOL N/A 03/30/2017   Procedure: COLONOSCOPY WITH PROPOFOL;  Surgeon: Rachael FeeJacobs, Daniel P, MD;  Location: [facility];  Service: Endoscopy;  Laterality: N/A;   colonscopy  2008   OOPHORECTOMY     bilateral   SUPRANUMERARY NIPPLE EXCISION     right breast   Family History  Problem Relation Age of Onset   Heart disease Mother        CHF   Arthritis Mother    Diabetes Mother    COPD Father    Cancer Father        lung   Kidney disease Sister        kidney failure   Social History   Socioeconomic History   Marital status: Divorced    Spouse name: Not on file   Number of children: 0   Years of education: 12   Highest education level: High school graduate  Occupational History   Occupation: Retired  Tobacco Use   Smoking status: Never   Smokeless tobacco: Never  Building services engineerVaping Use   Vaping Use: Never used  Substance and Sexual Activity   Alcohol use: No   Drug use: No   Sexual activity: Not on file  Other Topics Concern   Not on file  Social History Narrative   Divorced for 16 years    No children    Lives with  Niece & nephew   Regular exercise: no   Caffeine Use:  1-2 drinks daily            Social Determinants of Health   Financial Resource Strain: Low Risk    Difficulty of Paying Living Expenses: Not hard at all  Food Insecurity: No Food Insecurity   Worried About Programme researcher, broadcasting/film/video in the Last Year: Never true   Ran Out of Food in the Last Year: Never true  Transportation Needs: No Transportation Needs   Lack of Transportation (Medical): No   Lack of Transportation (Non-Medical): No  Physical Activity: Inactive   Days of Exercise per Week: 0 days   Minutes of Exercise per Session: 0 min  Stress: No Stress Concern Present   Feeling of Stress : Not at all  Social Connections: Moderately Integrated   Frequency of Communication with Friends and Family: More than three times a week   Frequency of Social Gatherings with Friends and Family: More than three times a  week   Attends Religious Services: More than 4 times per year   Active Member of [employer] or Organizations: Yes   Attends Engineer, structural: More than 4 times per year   Marital Status: Never married   Clinical Intake:   Diabetic? No    Activities of Daily Living In your present state of health, do you have any difficulty performing the following activities: 12/02/2021 10/19/2021  Hearing? N N  Comment - objectively witness pt has some hearing issues Had to speak loudly  Vision? N N  Difficulty concentrating or making decisions? N Y  Walking or climbing stairs? N Y  Comment Patient uses cane uses walker  Dressing or bathing? N N  Doing errands, shopping? - N  Using the Toilet? N -  In the past six months, have you accidently leaked urine? N -  Do you have problems with loss of bowel control? N -  Managing your Medications? N -  Managing your Finances? N -  Housekeeping or managing your Housekeeping? N -  Some recent data might be hidden    Patient Care Team: Shirline Frees, NP as PCP - General (Family Medicine) Carrington Clamp, MD as Consulting Physician (Obstetrics and Gynecology)  Indicate any recent Medical Services you may have received from other than [facility] providers in the past year (date may be approximate).     Assessment:   This is a routine wellness examination for Carla Nash. Virtual Visit via Telephone Note  I connected with  Carla Nash on 12/02/21 at  9:45 AM EST by telephone and verified that I am speaking with the correct person using two identifiers.  Location: Patient: Home  Provider: Office Persons participating in the virtual visit: patient/Nurse Health Advisor   I discussed the limitations, risks, security and privacy concerns of performing an evaluation and management service by telephone and the availability of in person appointments. The patient expressed understanding and agreed to proceed.  Interactive audio and video  telecommunications were attempted between this nurse and patient, however failed, due to patient having technical difficulties OR patient did not have access to video capability.  We continued and completed visit with audio only.  Some vital signs may be absent or patient reported.   Tillie Rung, LPN   Hearing/Vision screen Hearing Screening - Comments:: No difficulty hearing Vision Screening - Comments:: Wears reading glasses. Followed by Dr Christain [place]  Dietary issues and exercise activities discussed: Current Exercise Habits: The patient does not  participate in regular exercise at present, Exercise limited by: None identified   Goals Addressed             This Visit's Progress    DIET - INCREASE WATER INTAKE       Drink 6 bottles of water every day.       Depression Screen PHQ 2/9 Scores 12/02/2021 10/19/2021 10/15/2020 12/03/2019 08/29/2018 08/29/2018 09/21/2017  PHQ - 2 Score 0 0 0 0 0 0 0    Fall Risk Fall Risk  12/02/2021 10/19/2021 10/15/2020 12/03/2019 09/24/2018  Falls in the past year? 0 0 0 0 0  Comment - - - - Emmi Telephone Survey: data to providers prior to load  Number falls in past yr: 0 - - 0 -  Injury with Fall? 0 - - - -  Risk for fall due to : - - - Medication side effect -  Follow up - - - Falls evaluation completed;Education provided;Falls prevention discussed -    FALL RISK PREVENTION PERTAINING TO THE HOME:  Any stairs in or around the home? Yes  If so, are there any without handrails? No  Home free of loose throw rugs in walkways, pet beds, electrical cords, etc? Yes  Adequate lighting in your home to reduce risk of falls? Yes   ASSISTIVE DEVICES UTILIZED TO PREVENT FALLS:  Life alert? No  Use of a cane, walker or w/c? Yes  Grab bars in the bathroom? No  Shower chair or bench in shower? Yes  Elevated toilet seat or a handicapped toilet? No   TIMED UP AND GO:  Was the test performed? No . Audio Visit  Cognitive Function: MMSE - Mini  Mental State Exam 08/29/2018  Not completed: (No Data)     6CIT Screen 12/02/2021 12/03/2019  What Year? 0 points 0 points  What month? 0 points 0 points  What time? 0 points 0 points  Count back from 20 0 points 0 points  Months in reverse 0 points 0 points  Repeat phrase 0 points 0 points  Total Score 0 0    Immunizations Immunization History  Administered Date(s) Administered   Fluad Quad(high Dose 65+) 09/27/2019, 09/17/2020, 10/19/2021   Influenza Split 10/20/2011   Influenza Whole 09/07/2010   Influenza, High Dose Seasonal PF 07/21/2015, 09/16/2016, 09/21/2017, 08/29/2018   Influenza-Unspecified 08/10/2015   Moderna Sars-Covid-2 Vaccination 03/05/2020, 03/05/2020, 04/02/2020, 04/02/2020   Pneumococcal Conjugate-13 07/21/2015   Pneumococcal Polysaccharide-23 03/16/2012, 09/21/2017   Td 12/09/2008   Tdap 01/07/2014   Zoster, Live 03/16/2012    Covid-19 vaccine status: Completed vaccines  Qualifies for Shingles Vaccine? Yes   Zostavax completed No   Shingrix Completed?: No.    Education has been provided regarding the importance of this vaccine. Patient has been advised to call insurance company to determine out of pocket expense if they have not yet received this vaccine. Advised may also receive vaccine at local pharmacy or Health Dept. Verbalized acceptance and understanding.  Screening Tests Health Maintenance  Topic Date Due   Zoster Vaccines- Shingrix (1 of 2) Never done   COVID-19 Vaccine (5 - Booster) 05/28/2020   MAMMOGRAM  09/28/2022   TETANUS/TDAP  01/08/2024   COLONOSCOPY (Pts [id number] Insurance coverage will need to be confirmed)  03/31/2027   Pneumonia Vaccine 6765+ Years old  Completed   INFLUENZA VACCINE  Completed   DEXA SCAN  Completed   Hepatitis C Screening  Completed   HPV VACCINES  Aged Out    Health Maintenance  Health  Maintenance Due  Topic Date Due   Zoster Vaccines- Shingrix (1 of 2) Never done   COVID-19 Vaccine (5 - Booster)  05/28/2020     Additional Screening:   Vision Screening: Recommended annual ophthalmology exams for early detection of glaucoma and other disorders of the eye. Is the patient up to date with their annual eye exam?  Yes  Who is the provider or what is the name of the office in which the patient attends annual eye exams? Followed by Dr Briant Sites   Dental Screening: Recommended annual dental exams for proper oral hygiene  Community Resource Referral / Chronic Care Management:  CRR required this visit?  No   CCM required this visit?  No      Plan:     I have personally reviewed and noted the following in the patients chart:   Medical and social history Use of alcohol, tobacco or illicit drugs  Current medications and supplements including opioid prescriptions. Patient currently not taking opioids Functional ability and status Nutritional status Physical activity Advanced directives List of other physicians Hospitalizations, surgeries, and ER visits in previous 12 months Vitals Screenings to include cognitive, depression, and falls Referrals and appointments  In addition, I have reviewed and discussed with patient certain preventive protocols, quality metrics, and best practice recommendations. A written personalized care plan for preventive services as well as general preventive health recommendations were provided to patient.     Tillie Rung, LPN   02/13/1447

## 2021-12-02 NOTE — Patient Instructions (Addendum)
[name] , Thank you for taking time to come for your Medicare Wellness Visit. I appreciate your ongoing commitment to your health goals. Please review the following plan we discussed and let me know if I can assist you in the future.   These are the goals we discussed:  Goals      Cut out extra servings     DIET - INCREASE WATER INTAKE     Drink 6 bottles of water every day.     Exercise 150 min/wk Moderate Activity     Continue to walk as much as you can      Increase physical activity        This is a list of the screening recommended for you and due dates:  Health Maintenance  Topic Date Due   Zoster (Shingles) Vaccine (1 of 2) Never done   COVID-19 Vaccine (5 - Booster) 05/28/2020   Mammogram  09/28/2022   Tetanus Vaccine  01/08/2024   Colon Cancer Screening  03/31/2027   Pneumonia Vaccine  Completed   Flu Shot  Completed   DEXA scan (bone density measurement)  Completed   Hepatitis C Screening: USPSTF Recommendation to screen - Ages 53-79 yo.  Completed   HPV Vaccine  Aged Out   Advanced directives: No  Conditions/risks identified: None  Next appointment: Follow up in one year for your annual wellness visit    Preventive Care 65 Years and Older, Female Preventive care refers to lifestyle choices and visits with your health care provider that can promote health and wellness. What does preventive care include? A yearly physical exam. This is also called an annual well check. Dental exams once or twice a year. Routine eye exams. Ask your health care provider how often you should have your eyes checked. Personal lifestyle choices, including: Daily care of your teeth and gums. Regular physical activity. Eating a healthy diet. Avoiding tobacco and drug use. Limiting alcohol use. Practicing safe sex. Taking low-dose aspirin every day. Taking vitamin and mineral supplements as recommended by your health care provider. What happens during an annual well  check? The services and screenings done by your health care provider during your annual well check will depend on your age, overall health, lifestyle risk factors, and family history of disease. Counseling  Your health care provider may ask you questions about your: Alcohol use. Tobacco use. Drug use. Emotional well-being. Home and relationship well-being. Sexual activity. Eating habits. History of falls. Memory and ability to understand (cognition). Work and work Astronomer. Reproductive health. Screening  You may have the following tests or measurements: Height, weight, and BMI. Blood pressure. Lipid and cholesterol levels. These may be checked every 5 years, or more frequently if you are over 2 years old. Skin check. Lung cancer screening. You may have this screening every year starting at age 70 if you have a 30-pack-year history of smoking and currently smoke or have quit within the past 15 years. Fecal occult blood test (FOBT) of the stool. You may have this test every year starting at age 3. Flexible sigmoidoscopy or colonoscopy. You may have a sigmoidoscopy every 5 years or a colonoscopy every 10 years starting at age 104. Hepatitis C blood test. Hepatitis B blood test. Sexually transmitted disease (STD) testing. Diabetes screening. This is done by checking your blood sugar (glucose) after you have not eaten for a while (fasting). You may have this done every 1-3 years. Bone density scan. This is done to screen for osteoporosis. You  may have this done starting at age 74. Mammogram. This may be done every 1-2 years. Talk to your health care provider about how often you should have regular mammograms. Talk with your health care provider about your test results, treatment options, and if necessary, the need for more tests. Vaccines  Your health care provider may recommend certain vaccines, such as: Influenza vaccine. This is recommended every year. Tetanus, diphtheria, and  acellular pertussis (Tdap, Td) vaccine. You may need a Td booster every 10 years. Zoster vaccine. You may need this after age 74. Pneumococcal 13-valent conjugate (PCV13) vaccine. One dose is recommended after age 74. Pneumococcal polysaccharide (PPSV23) vaccine. One dose is recommended after age 74. Talk to your health care provider about which screenings and vaccines you need and how often you need them. This information is not intended to replace advice given to you by your health care provider. Make sure you discuss any questions you have with your health care provider. Document Released: 11/20/2015 Document Revised: 07/13/2016 Document Reviewed: 08/25/2015 Elsevier Interactive Patient Education  2017 [employer]  Fall Prevention in the Home Falls can cause injuries. They can happen to people of all ages. There are many things you can do to make your home safe and to help prevent falls. What can I do on the outside of my home? Regularly fix the edges of walkways and driveways and fix any cracks. Remove anything that might make you trip as you walk through a door, such as a raised step or threshold. Trim any bushes or trees on the path to your home. Use bright outdoor lighting. Clear any walking paths of anything that might make someone trip, such as rocks or tools. Regularly check to see if handrails are loose or broken. Make sure that both sides of any steps have handrails. Any raised decks and porches should have guardrails on the edges. Have any leaves, snow, or ice cleared regularly. Use sand or salt on walking paths during winter. Clean up any spills in your garage right away. This includes oil or grease spills. What can I do in the bathroom? Use night lights. Install grab bars by the toilet and in the tub and shower. Do not use towel bars as grab bars. Use non-skid mats or decals in the tub or shower. If you need to sit down in the shower, use a plastic, non-slip stool. Keep  the floor dry. Clean up any water that spills on the floor as soon as it happens. Remove soap buildup in the tub or shower regularly. Attach bath mats securely with double-sided non-slip rug tape. Do not have throw rugs and other things on the floor that can make you trip. What can I do in the bedroom? Use night lights. Make sure that you have a light by your bed that is easy to reach. Do not use any sheets or blankets that are too big for your bed. They should not hang down onto the floor. Have a firm chair that has side arms. You can use this for support while you get dressed. Do not have throw rugs and other things on the floor that can make you trip. What can I do in the kitchen? Clean up any spills right away. Avoid walking on wet floors. Keep items that you use a lot in easy-to-reach places. If you need to reach something above you, use a strong step stool that has a grab bar. Keep electrical cords out of the way. Do not use floor  polish or wax that makes floors slippery. If you must use wax, use non-skid floor wax. Do not have throw rugs and other things on the floor that can make you trip. What can I do with my stairs? Do not leave any items on the stairs. Make sure that there are handrails on both sides of the stairs and use them. Fix handrails that are broken or loose. Make sure that handrails are as long as the stairways. Check any carpeting to make sure that it is firmly attached to the stairs. Fix any carpet that is loose or worn. Avoid having throw rugs at the top or bottom of the stairs. If you do have throw rugs, attach them to the floor with carpet tape. Make sure that you have a light switch at the top of the stairs and the bottom of the stairs. If you do not have them, ask someone to add them for you. What else can I do to help prevent falls? Wear shoes that: Do not have high heels. Have rubber bottoms. Are comfortable and fit you well. Are closed at the toe. Do not  wear sandals. If you use a stepladder: Make sure that it is fully opened. Do not climb a closed stepladder. Make sure that both sides of the stepladder are locked into place. Ask someone to hold it for you, if possible. Clearly mark and make sure that you can see: Any grab bars or handrails. First and last steps. Where the edge of each step is. Use tools that help you move around (mobility aids) if they are needed. These include: Canes. Walkers. Scooters. Crutches. Turn on the lights when you go into a dark area. Replace any light bulbs as soon as they burn out. Set up your furniture so you have a clear path. Avoid moving your furniture around. If any of your floors are uneven, fix them. If there are any pets around you, be aware of where they are. Review your medicines with your doctor. Some medicines can make you feel dizzy. This can increase your chance of falling. Ask your doctor what other things that you can do to help prevent falls. This information is not intended to replace advice given to you by your health care provider. Make sure you discuss any questions you have with your health care provider. Document Released: 08/20/2009 Document Revised: 03/31/2016 Document Reviewed: 11/28/2014 Elsevier Interactive Patient Education  2017 [employer].

## 2022-10-20 ENCOUNTER — Encounter: Payer: Medicare Other | Admitting: Adult Health

## 2022-11-24 DIAGNOSIS — Z1231 Encounter for screening mammogram for malignant neoplasm of breast: Secondary | ICD-10-CM | POA: Diagnosis not present

## 2022-12-07 ENCOUNTER — Encounter: Payer: Medicare Other | Admitting: Adult Health

## 2023-01-03 ENCOUNTER — Telehealth (INDEPENDENT_AMBULATORY_CARE_PROVIDER_SITE_OTHER): Payer: Medicare Other | Admitting: Family Medicine

## 2023-01-03 VITALS — Ht 59.0 in | Wt 199.0 lb

## 2023-01-03 DIAGNOSIS — Z0000 Encounter for general adult medical examination without abnormal findings: Secondary | ICD-10-CM

## 2023-01-03 NOTE — Progress Notes (Signed)
PATIENT CHECK-IN and HEALTH RISK ASSESSMENT QUESTIONNAIRE:  -completed by phone/video for upcoming Medicare Preventive Visit  Pre-Visit Check-in: 1)Vitals (height, wt, BP, etc) - record in vitals section for visit on day of visit 2)Review and Update Medications, Allergies PMH, Surgeries, Social history in Epic 3)Hospitalizations in the last year with date/reason? no  4)Review and Update Care Team (patient's specialists) in Epic 5) Complete PHQ9 in Epic  6) Complete Fall Screening in Epic 7)Review all Health Maintenance Due and order under PCP if not done.  8)Medicare Wellness Questionnaire: Answer theses question about your habits: Do you drink alcohol? no If yes, how many drinks do you have a day? Have you ever smoked?NO Quit date if applicable? N/a  How many packs a day do/did you smoke? N/a Do you use smokeless tobacco?No Do you use an illicit drugs?No Do you exercises? YesIF so, what type and how many days/minutes per week?2 days of weeks for 30 mins. Walking. Are you sexually active? No Number of partners?n/a Reports she avoids sweets Typical breakfast: waffle, drink V8 juice, water Typical lunch-sandwich and and bottle of water Typical dinner: don't eat much for dinner, would eat baked potato. don't eat vegetables as it upset stomach.  Typical snacks: a bag of chip  Beverages: water, pepsi once in a while  Answer theses question about you: Can you perform most household chores?No Do you find it hard to follow a conversation in a noisy room?no Do you often ask people to speak up or repeat themselves?no Do you feel that you have a problem with memory?no Do you balance your checkbook and or bank acounts?no Do you feel safe at home?yes Last dentist visit? Pt states it has been a while back. Has no dental plan Do you need assistance with any of the following: Please note if so   Driving? yes  Feeding yourself? no  Getting from bed to chair? no  Getting to the toilet?  no  Bathing or showering? no  Dressing yourself? no  Managing money? no  Climbing a flight of stairs no  Preparing meals? "Niece cook for me"  Do you have Advanced Directives in place (Living Will, Healthcare Power or [place])? yes   Last eye Exam and location? In December  at [place] you currently use prescribed or non-prescribed narcotic or opioid pain medications?no  Do you have a history or close family history of breast, ovarian, tubal or peritoneal cancer or a family member with BRCA (breast cancer susceptibility 1 and 2) gene mutations? Mother had breast cancer. Pt reports she had mammogram in December. Dr. Bobbye Nash  Nurse/Assistant Credentials/time stamp: Carla Nash/CMA/9:36am   ----------------------------------------------------------------------------------------------------------------------------------------------------------------------------------------------------------------------   [place] VISIT WITH PROVIDER: (Welcome to [facility], initial annual wellness or annual wellness exam)  Virtual Visit via Phone Note  I connected with Carla Nash on 01/03/23 by phone and verified that I am speaking with the correct person using two identifiers.  Location patient: home Location provider:work or home office Persons participating in the virtual visit: patient, provider  Concerns and/or follow up today: denies any - reports is seeing her PCP tomorrow in the office.    See HM section in Epic for other details of completed HM.    ROS: negative for report of fevers, unintentional weight loss, vision changes, vision loss, hearing loss or change, chest pain, sob, hemoptysis, melena, hematochezia, hematuria, genital discharge or lesions, falls, bleeding or bruising, loc, thoughts of suicide or self harm, memory loss  Patient-completed extensive health risk  assessment - reviewed and discussed with the patient: See Health Risk Assessment  completed with patient prior to the visit either above or in recent phone note. This was reviewed in detailed with the patient today and appropriate recommendations, orders and referrals were placed as needed per Summary below and patient instructions.   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    Glaucoma    History of DVT of lower extremity 1990s   Obesity    Pneumonia    as child    Past Surgical History:  Procedure Laterality Date   ABDOMINAL HYSTERECTOMY  2006   total   breast biopsy 2008 Right    benign   CATARACT EXTRACTION     07/05/17 (left eye) and 07/26/17 (right eye)   COLONOSCOPY WITH PROPOFOL N/A 03/30/2017   Procedure: COLONOSCOPY WITH PROPOFOL;  Surgeon: Milus Banister, MD;  Location: [facility];  Service: Endoscopy;  Laterality: N/A;   colonscopy  2008   OOPHORECTOMY     bilateral   SUPRANUMERARY NIPPLE EXCISION     right breast    Social History   Socioeconomic History   Marital status: Divorced    Spouse name: Not on file   Number of children: 0   Years of education: 12   Highest education level: High school graduate  Occupational History   Occupation: Retired  Tobacco Use   Smoking status: Never   Smokeless tobacco: Never  Scientific laboratory technician Use: Never used  Substance and Sexual Activity   Alcohol use: No   Drug use: No   Sexual activity: Not on file  Other Topics Concern   Not on file  Social History Narrative   Divorced for 16 years    No children    Lives with Niece & nephew   Regular exercise: no   Caffeine  Use:  1-2 drinks daily            Social Determinants of Health   Financial Resource Strain: Low Risk  (12/02/2021)   Overall Financial Resource Strain (CARDIA)    Difficulty of Paying Living Expenses: Not hard at all  Food Insecurity: No Food Insecurity (12/02/2021)   Hunger Vital Sign    Worried About Running Out of Food in the Last Year: Never true    [place] in the Last Year: Never true  Transportation Needs: No Transportation Needs (12/02/2021)   PRAPARE - Hydrologist (Medical): No    Lack of Transportation (Non-Medical): No  Physical Activity: Inactive (12/02/2021)   Exercise Vital Sign    Days of Exercise per Week: 0 days    Minutes of Exercise per Session: 0 min  Stress: No Stress Concern Present (12/02/2021)   [place]    Feeling of Stress : Not at all  Social Connections: Moderately Integrated (12/02/2021)   Social Connection and Isolation Panel [NHANES]    Frequency of Communication with Friends and Family: More than  three times a week    Frequency of Social Gatherings with Friends and Family: More than three times a week    Attends Religious Services: More than 4 times per year    Active Member of Clubs or Organizations: Yes    Attends Music therapist: More than 4 times per year    Marital Status: Never married  Intimate Partner Violence: Not At Risk (12/02/2021)   Humiliation, Afraid, Rape, and Kick questionnaire    Fear of Current or Ex-Partner: No    Emotionally Abused: No    Physically Abused: No    Sexually Abused: No    Family History  Problem Relation Age of Onset   Heart disease Mother        CHF   Arthritis Mother    Diabetes Mother    COPD Father    Cancer Father        lung   Kidney disease Sister        kidney failure    Current Outpatient Medications on File Prior to Visit  Medication Sig Dispense Refill   acetaminophen  (TYLENOL) 500 MG tablet Take 1 tablet (500 mg total) by mouth 3 (three) times daily. (Patient taking differently: Take 500 mg by mouth every 4 (four) hours as needed for mild pain.) 90 tablet prn   No current facility-administered medications on file prior to visit.    Allergies  Allergen Reactions   Penicillins Shortness Of Breath, Itching and Rash    Has patient had a PCN reaction causing immediate rash, facial/tongue/throat swelling, SOB or lightheadedness with hypotension: Yes Has patient had a PCN reaction causing severe rash involving mucus membranes or skin necrosis: Unknown Has patient had a PCN reaction that required hospitalization: No Has patient had a PCN reaction occurring within the last 10 years: No If all of the above answers are "NO", then may proceed with Cephalosporin use.   Shingrix [Zoster Vac Recomb Adjuvanted] Itching    Severe per patient       Physical Exam There were no vitals filed for this visit. Estimated body mass index is 40.19 kg/m as calculated from the following:   Height as of this encounter: '4\' 11"'$  (1.499 m).   Weight as of this encounter: 199 lb (90.3 kg).  EKG (optional): deferred due to virtual visit  GENERAL: alert, oriented, no acute distress detected, full vision exam deferred due to pandemic and/or virtual encounter  PSYCH/NEURO: pleasant and cooperative, no obvious depression or anxiety, speech and thought processing grossly intact, Cognitive function grossly intact        01/03/2023    9:22 AM 12/02/2021    9:49 AM 10/19/2021    9:30 AM 10/15/2020    9:19 AM 12/03/2019    9:18 AM  Depression screen PHQ 2/9  Decreased Interest 0 0 0 0 0  Down, Depressed, Hopeless 0 0 0 0 0  PHQ - 2 Score 0 0 0 0 0       12/03/2019    9:18 AM 10/15/2020    9:19 AM 10/19/2021    9:30 AM 12/02/2021    9:52 AM 01/03/2023    9:41 AM  Fall Risk  Falls in the past year? 0 0 0 0 0  Was there an injury with Fall?    0 0  Fall Risk Category Calculator     0 0  Fall Risk Category (Retired)    Low   (RETIRED) Patient Fall Risk Level Low fall risk  Low fall risk Low fall risk   Patient at Risk for Falls Due to Medication side effect    No Fall Risks  Fall risk Follow up Falls evaluation completed;Education provided;Falls prevention discussed    Falls evaluation completed     SUMMARY AND PLAN:  Encounter for Medicare annual wellness exam   Discussed applicable health maintenance/preventive health measures and advised and referred or ordered per patient preferences:  Health Maintenance  Topic Date Due   INFLUENZA VACCINE  06/07/2022, reports she will get tomorrow at the office when sees her PCP.    COVID-19 Vaccine (5 - 2023-24 season) 07/08/2022, reports will bring her vaccine record to her appt tomorrow to update   MAMMOGRAM  09/28/2022 - reports had her mammogram in December with her gyn office. Advised staff to request a copy to forward to PCP.    Zoster Vaccines- Shingrix (1 of 2) Declined. She reports PCP told her she did not need it. She agrees to ask PCP tomorrow at visit.    Medicare Annual Wellness (AWV)  01/04/2024   DTaP/Tdap/Td (3 - Td or Tdap) 01/08/2024   COLONOSCOPY (Pts 45-49yr Insurance coverage will need to be confirmed)  03/31/2027   Pneumonia Vaccine 75 Years old  Completed   DEXA SCAN  Completed   Hepatitis C Screening  Completed   HPV VACCINES  Aged Out   Last bone density test was in 2019. She feels she does not need it. Discussed. She agrees to ask PCP if she changes her mind and wants to get it. She has not been taking any vit D and calcium.   Education and counseling on the following was provided based on the above review of health and a plan/checklist for the patient, along with additional information discussed, was provided for the patient in the patient instructions :   -She denies having any issues with her hearing to me.  -Advised and counseled on maintaining healthy weight and healthy lifestyle -  including the importance of a healthy diet, regular physical activity, social connections and stress management. -Advised and counseled on a whole foods based healthy diet and regular exercise: discussed a heart healthy whole foods based diet at length. A summary of a healthy diet was provided in the Patient Instructions. However, she reports she can not eat vegetables or fruits as they upset her stomach. She does tolerate V8. We discussed different ways to prepare foods, but she is not interested at this time as feels has tried in the past. Recommended regular exercise and discussed options within the community. Congratulated her on the exercise she is doing and discussed exercise guidelines - provided in patient instructions.  -Advise yearly dental visits at minimum and regular eye exams   Follow up: see patient instructions     Patient Instructions  I really enjoyed getting to talk with you today! I am available on Tuesdays and Thursdays for virtual visits if you have any questions or concerns, or if I can be of any further assistance.   CHECKLIST FROM ANNUAL WELLNESS VISIT:  -Follow up (please call to schedule if not scheduled after visit):  -Inperson visit with your Primary Doctor office: as scheduled tomorrow -yearly for annual wellness visit with primary care office  Here is a list of your preventive care/health maintenance measures and the plan for each if any are due:  Health Maintenance  Topic Date Due   INFLUENZA VACCINE  06/07/2022 - please get tomorrow at your visit as planned   COVID-19  Vaccine (5 - 2023-24 season) 07/08/2022, please bring record of your covid boosters to your next appointment and we can update them   MAMMOGRAM  09/28/2022, you reported getting a mammogram with your gynecologist in December of 2023. I have asked the nurse assistant to request the report. Please ensure that you get a mammogram every year.    Medicare Annual Wellness (AWV)  12/03/2023   Zoster  Vaccines- Shingrix (1 of 2) Can get at the pharmacy if you wish.    DTaP/Tdap/Td (3 - Td or Tdap) 01/08/2024   COLONOSCOPY (Pts 45-22yr Insurance coverage will need to be confirmed)  03/31/2027   Pneumonia Vaccine 77 Years old  Completed   DEXA SCAN  Completed, you can get this every 2 years if you wish. Please request with your doctor if you change your mind.    Hepatitis C Screening  Completed   HPV VACCINES  Aged Out    -See a dentist at least yearly  -Get your eyes checked and then per your eye specialist's recommendations  -Other issues addressed today:  -I have included below further information regarding a healthy whole foods based diet, physical activity guidelines for adults, stress management and opportunities for social connections. I hope you find this information useful. I know that the vegetable part of this is difficult for you. Perhaps you could try soups with vegetables or smoothies with fruits and vegetables to see if that is easier on your stomach as this would be similar to V8.   -----------------------------------------------------------------------------------------------------------------------------------------------------------------------------------------------------------------------------------------------------------  NUTRITION: -eat real food: lots of colorful vegetables (half the plate) and fruits -5-7 servings of vegetables and fruits per day (fresh or steamed is best), exp. 2 servings of vegetables with lunch and dinner and 2 servings of fruit per day. Berries and greens such as kale and collards are great choices.  -consume on a regular basis: whole grains (make sure first ingredient on label contains the word "whole"), fresh fruits, fish, nuts, seeds, healthy oils (such as olive oil, avocado oil, grape seed oil) -may eat small amounts of dairy and lean meat on occasion, but avoid processed meats such as ham, bacon, lunch meat, etc. -drink water -try to  avoid fast food and pre-packaged foods, processed meat -most experts advise limiting sodium to < '2300mg'$  per day, should limit further is any chronic conditions such as high blood pressure, heart disease, diabetes, etc. The American Heart Association advised that < '1500mg'$  is is ideal -try to avoid foods that contain any ingredients with names you do not recognize  -try to avoid sugar/sweets (except for the natural sugar that occurs in fresh fruit) -try to avoid sweet drinks -try to avoid white rice, white bread, pasta (unless whole grain), white or yellow potatoes  EXERCISE GUIDELINES FOR ADULTS: -if you wish to increase your physical activity, do so gradually and with the approval of your doctor -STOP and seek medical care immediately if you have any chest pain, chest discomfort or trouble breathing when starting or increasing exercise  -move and stretch your body, legs, feet and arms when sitting for long periods -Physical activity guidelines for optimal health in adults: -least 150 minutes per week of aerobic exercise (can talk, but not sing) once approved by your doctor, 20-30 minutes of sustained activity or two 10 minute episodes of sustained activity every day.  -resistance training at least 2 days per week if approved by your doctor -balance exercises 3+ days per week:   Stand somewhere where you have something sturdy  to hold onto if you lose balance.    1) lift up on toes, start with 5x per day and work up to 20x   2) stand and lift on leg straight out to the side so that foot is a few inches of the floor, start with 5x each side and work up to 20x each side   3) stand on one foot, start with 5 seconds each side and work up to 20 seconds on each side  If you need ideas or help with getting more active:  -Silver sneakers https://tools.silversneakers.com  -Walk with a Doc: http://stephens-thompson.biz/  -try to include resistance (weight lifting/strength building) and balance exercises  twice per week: or the following link for ideas: ChessContest.fr  UpdateClothing.com.cy  STRESS MANAGEMENT: -can try meditating, or just sitting quietly with deep breathing while intentionally relaxing all parts of your body for 5 minutes daily -if you need further help with stress, anxiety or depression please follow up with your primary doctor or contact the wonderful folks at [place]: [place]: -options in [place] if you wish to engage in more social and exercise related activities:  -Silver sneakers https://tools.silversneakers.com  -Walk with a Doc: http://stephens-thompson.biz/  -Check out the [place] 50+ section on the [place] [employer] (hiking clubs, book clubs, cards and games, chess, exercise classes, aquatic classes and much more) - see the website for details: https://www.[place].gov/departments/parks-recreation/active-adults50  -YouTube has lots of exercise videos for different ages and abilities as well  -[place] (a variety of indoor and outdoor inperson activities for adults). ([phone])379-0562. [address]  -Virtual Online Classes (a variety of topics): see seniorplanet.org or call [phone]  -consider volunteering at a school, hospice center, church, senior center or elsewhere           Lucretia Kern, DO

## 2023-01-03 NOTE — Patient Instructions (Addendum)
I really enjoyed getting to talk with you today! I am available on Tuesdays and Thursdays for virtual visits if you have any questions or concerns, or if I can be of any further assistance.   CHECKLIST FROM ANNUAL WELLNESS VISIT:  -Follow up (please call to schedule if not scheduled after visit):  -Inperson visit with your Primary Doctor office: as scheduled tomorrow -yearly for annual wellness visit with primary care office  Here is a list of your preventive care/health maintenance measures and the plan for each if any are due:  Health Maintenance  Topic Date Due   INFLUENZA VACCINE  06/07/2022 - please get tomorrow at your visit as planned   COVID-19 Vaccine (5 - 2023-24 season) 07/08/2022, please bring record of your covid boosters to your next appointment and we can update them   MAMMOGRAM  09/28/2022, you reported getting a mammogram with your gynecologist in December of 2023. I have asked the nurse assistant to request the report. Please ensure that you get a mammogram every year.    Medicare Annual Wellness (AWV)  12/03/2023   Zoster Vaccines- Shingrix (1 of 2) Can get at the pharmacy if you wish.    DTaP/Tdap/Td (3 - Td or Tdap) 01/08/2024   COLONOSCOPY (Pts 45-29yr Insurance coverage will need to be confirmed)  03/31/2027   Pneumonia Vaccine 75 Years old  Completed   DEXA SCAN  Completed, you can get this every 2 years if you wish. Please request with your doctor if you change your mind.    Hepatitis C Screening  Completed   HPV VACCINES  Aged Out    -See a dentist at least yearly  -Get your eyes checked and then per your eye specialist's recommendations  -Other issues addressed today:  -I have included below further information regarding a healthy whole foods based diet, physical activity guidelines for adults, stress management and opportunities for social connections. I hope you find this information useful. I know that the vegetable part of this is difficult for you.  Perhaps you could try soups with vegetables or smoothies with fruits and vegetables to see if that is easier on your stomach as this would be similar to V8.   -----------------------------------------------------------------------------------------------------------------------------------------------------------------------------------------------------------------------------------------------------------  NUTRITION: -eat real food: lots of colorful vegetables (half the plate) and fruits -5-7 servings of vegetables and fruits per day (fresh or steamed is best), exp. 2 servings of vegetables with lunch and dinner and 2 servings of fruit per day. Berries and greens such as kale and collards are great choices.  -consume on a regular basis: whole grains (make sure first ingredient on label contains the word "whole"), fresh fruits, fish, nuts, seeds, healthy oils (such as olive oil, avocado oil, grape seed oil) -may eat small amounts of dairy and lean meat on occasion, but avoid processed meats such as ham, bacon, lunch meat, etc. -drink water -try to avoid fast food and pre-packaged foods, processed meat -most experts advise limiting sodium to < '2300mg'$  per day, should limit further is any chronic conditions such as high blood pressure, heart disease, diabetes, etc. The American Heart Association advised that < '1500mg'$  is is ideal -try to avoid foods that contain any ingredients with names you do not recognize  -try to avoid sugar/sweets (except for the natural sugar that occurs in fresh fruit) -try to avoid sweet drinks -try to avoid white rice, white bread, pasta (unless whole grain), white or yellow potatoes  EXERCISE GUIDELINES FOR ADULTS: -if you wish to increase your physical  activity, do so gradually and with the approval of your doctor -STOP and seek medical care immediately if you have any chest pain, chest discomfort or trouble breathing when starting or increasing exercise  -move and  stretch your body, legs, feet and arms when sitting for long periods -Physical activity guidelines for optimal health in adults: -least 150 minutes per week of aerobic exercise (can talk, but not sing) once approved by your doctor, 20-30 minutes of sustained activity or two 10 minute episodes of sustained activity every day.  -resistance training at least 2 days per week if approved by your doctor -balance exercises 3+ days per week:   Stand somewhere where you have something sturdy to hold onto if you lose balance.    1) lift up on toes, start with 5x per day and work up to 20x   2) stand and lift on leg straight out to the side so that foot is a few inches of the floor, start with 5x each side and work up to 20x each side   3) stand on one foot, start with 5 seconds each side and work up to 20 seconds on each side  If you need ideas or help with getting more active:  -Silver sneakers https://tools.silversneakers.com  -Walk with a Doc: http://stephens-thompson.biz/  -try to include resistance (weight lifting/strength building) and balance exercises twice per week: or the following link for ideas: ChessContest.fr  UpdateClothing.com.cy  STRESS MANAGEMENT: -can try meditating, or just sitting quietly with deep breathing while intentionally relaxing all parts of your body for 5 minutes daily -if you need further help with stress, anxiety or depression please follow up with your primary doctor or contact the wonderful folks at [employer]: [place]: -options in [place] if you wish to engage in more social and exercise related activities:  -Silver sneakers https://tools.silversneakers.com  -Walk with a Doc: http://stephens-thompson.biz/  -Check out the [place] 50+ section on the [employer] (hiking clubs, book clubs, cards and  games, chess, exercise classes, aquatic classes and much more) - see the website for details: https://www.[place].gov/departments/parks-recreation/active-adults50  -YouTube has lots of exercise videos for different ages and abilities as well  -[place] (a variety of indoor and outdoor inperson activities for adults). [phone]. [address]  -Virtual Online Classes (a variety of topics): see seniorplanet.org or call 9027649738  -consider volunteering at a school, hospice center, church, senior center or elsewhere

## 2023-01-04 ENCOUNTER — Ambulatory Visit (INDEPENDENT_AMBULATORY_CARE_PROVIDER_SITE_OTHER): Payer: Medicare Other | Admitting: Adult Health

## 2023-01-04 ENCOUNTER — Encounter: Payer: Self-pay | Admitting: Adult Health

## 2023-01-04 VITALS — BP 122/84 | HR 73 | Temp 98.3°F | Ht 58.5 in | Wt 214.0 lb

## 2023-01-04 DIAGNOSIS — E785 Hyperlipidemia, unspecified: Secondary | ICD-10-CM | POA: Diagnosis not present

## 2023-01-04 DIAGNOSIS — M159 Polyosteoarthritis, unspecified: Secondary | ICD-10-CM

## 2023-01-04 DIAGNOSIS — Z6841 Body Mass Index (BMI) 40.0 and over, adult: Secondary | ICD-10-CM | POA: Diagnosis not present

## 2023-01-04 DIAGNOSIS — L0291 Cutaneous abscess, unspecified: Secondary | ICD-10-CM | POA: Diagnosis not present

## 2023-01-04 DIAGNOSIS — Z23 Encounter for immunization: Secondary | ICD-10-CM | POA: Diagnosis not present

## 2023-01-04 LAB — CBC WITH DIFFERENTIAL/PLATELET
Basophils Absolute: 0.1 10*3/uL (ref 0.0–0.1)
Basophils Relative: 0.7 % (ref 0.0–3.0)
Eosinophils Absolute: 0.2 10*3/uL (ref 0.0–0.7)
Eosinophils Relative: 2.5 % (ref 0.0–5.0)
HCT: 33.1 % — ABNORMAL LOW (ref 36.0–46.0)
Hemoglobin: 10.9 g/dL — ABNORMAL LOW (ref 12.0–15.0)
Lymphocytes Relative: 39 % (ref 12.0–46.0)
Lymphs Abs: 3 10*3/uL (ref 0.7–4.0)
MCHC: 33 g/dL (ref 30.0–36.0)
MCV: 88.7 fl (ref 78.0–100.0)
Monocytes Absolute: 0.5 10*3/uL (ref 0.1–1.0)
Monocytes Relative: 6 % (ref 3.0–12.0)
Neutro Abs: 4 10*3/uL (ref 1.4–7.7)
Neutrophils Relative %: 51.8 % (ref 43.0–77.0)
Platelets: 237 10*3/uL (ref 150.0–400.0)
RBC: 3.73 Mil/uL — ABNORMAL LOW (ref 3.87–5.11)
RDW: 14.3 % (ref 11.5–15.5)
WBC: 7.7 10*3/uL (ref 4.0–10.5)

## 2023-01-04 LAB — LIPID PANEL
Cholesterol: 158 mg/dL (ref 0–200)
HDL: 46.2 mg/dL (ref >39.00)
LDL Cholesterol: 90 mg/dL (ref 0–99)
NonHDL: 111.73
Total CHOL/HDL Ratio: 3
Triglycerides: 107 mg/dL (ref 0.0–149.0)
VLDL: 21.4 mg/dL (ref 0.0–40.0)

## 2023-01-04 LAB — COMPREHENSIVE METABOLIC PANEL
ALT: 10 U/L (ref 0–35)
AST: 16 U/L (ref 0–37)
Albumin: 4 g/dL (ref 3.5–5.2)
Alkaline Phosphatase: 74 U/L (ref 39–117)
BUN: 12 mg/dL (ref 6–23)
CO2: 25 mEq/L (ref 19–32)
Calcium: 10.4 mg/dL (ref 8.4–10.5)
Chloride: 109 mEq/L (ref 96–112)
Creatinine, Ser: 0.64 mg/dL (ref 0.40–1.20)
GFR: 87.19 mL/min (ref >60.00)
Glucose, Bld: 85 mg/dL (ref 70–99)
Potassium: 3.5 mEq/L (ref 3.5–5.1)
Sodium: 143 mEq/L (ref 135–145)
Total Bilirubin: 0.4 mg/dL (ref 0.2–1.2)
Total Protein: 8.3 g/dL (ref 6.0–8.3)

## 2023-01-04 MED ORDER — DOXYCYCLINE HYCLATE 100 MG PO CAPS: 100.0000 mg | ORAL_CAPSULE | Freq: Two times a day (BID) | ORAL | 0 refills | Status: DC

## 2023-01-04 NOTE — Patient Instructions (Signed)
It was great seeing you today   We will follow up with you regarding your lab work   Please let me know if you need anything   I have sent in an antibiotic called Doxycyline for the abscess - you will take this twice daily for 10 days   Please follow up in one year or sooner if needed

## 2023-01-04 NOTE — Progress Notes (Signed)
Subjective:    Patient ID: Carla Nash, female    DOB: 1945-11-11, 77 y.o.   MRN: [id number]  HPI Patient presents for yearly preventative medicine examination. 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  Hyperlipidemia -not currently on any medication, she has been working on lifestyle modifications  Lab Results  Component Value Date   CHOL 177 10/19/2021   HDL 49.30 10/19/2021   LDLCALC 105 (H) 10/19/2021   LDLDIRECT 172.9 02/21/2012   TRIG 112.0 10/19/2021   CHOLHDL 4 10/19/2021   Obesity - she has not been exercising as much as she has in the past.  Wt Readings from Last 10 Encounters:  01/04/23 214 lb (97.1 kg)  01/03/23 199 lb (90.3 kg)  12/02/21 199 lb (90.3 kg)  10/19/21 199 lb (90.3 kg)  10/15/20 224 lb 3.2 oz (101.7 kg)  09/17/20 225 lb 3.2 oz (102.2 kg)  12/03/19 231 lb 6.4 oz (105 kg)  09/27/19 228 lb (103.4 kg)  09/25/18 229 lb (103.9 kg)  08/29/18 226 lb (102.5 kg)   Osteoarthritis -chronic pain in her knees and low back.  She does take Tylenol as needed.  Does not want anything else done at this time.  Abscess - noted an abscess on her left abdomen earlier this morning. Area is painful. She has not had much drainage from it.   All immunizations and health maintenance protocols were reviewed with the patient and needed orders were placed. Flu shot given today   Appropriate screening laboratory values were ordered for the patient including screening of hyperlipidemia, renal function and hepatic function.  Medication reconciliation,  past medical history, social history, problem list and allergies were reviewed in detail with the patient  Goals were established with regard to weight loss, exercise, and  diet in compliance with  medications    Review of Systems  Constitutional: Negative.   HENT: Negative.    Eyes: Negative.   Respiratory: Negative.    Cardiovascular: Negative.   Gastrointestinal: Negative.   Endocrine: Negative.   Genitourinary: Negative.   Musculoskeletal:  Positive for arthralgias and back pain.  Skin:  Positive for wound.  Allergic/Immunologic: Negative.   Neurological: Negative.   Hematological: Negative.   Psychiatric/Behavioral: Negative.     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   left leg    Glaucoma    History of DVT of lower extremity 1990s   Obesity    Pneumonia    as child    Social History   Socioeconomic History   Marital status: Divorced    Spouse name: Not on file   Number of children: 0   Years of education: 12   Highest education level: High school graduate  Occupational History   Occupation: Retired  Tobacco Use   Smoking status: Never   Smokeless tobacco: Never  Scientific laboratory technician Use: Never used  Substance and Sexual Activity   Alcohol use: No   Drug use: No   Sexual activity: Not on file  Other Topics Concern   Not on file  Social History Narrative   Divorced for 16 years  No children    Lives with Niece & nephew   Regular exercise: no   Caffeine Use:  1-2 drinks daily            Social Determinants of Health   Financial Resource Strain: Low Risk  (12/02/2021)   Overall Financial Resource Strain (CARDIA)    Difficulty of Paying Living Expenses: Not hard at all  Food Insecurity: No Food Insecurity (12/02/2021)   Hunger Vital Sign    Worried About Running Out of Food in the Last Year: Never true    Ran Out of Food in the Last Year: Never true  Transportation Needs: No Transportation Needs (12/02/2021)   PRAPARE - Hydrologist (Medical): No    Lack of Transportation  (Non-Medical): No  Physical Activity: Inactive (12/02/2021)   Exercise Vital Sign    Days of Exercise per Week: 0 days    Minutes of Exercise per Session: 0 min  Stress: No Stress Concern Present (12/02/2021)   [place]    Feeling of Stress : Not at all  Social Connections: Moderately Integrated (12/02/2021)   Social Connection and Isolation Panel [NHANES]    Frequency of Communication with Friends and Family: More than three times a week    Frequency of Social Gatherings with Friends and Family: More than three times a week    Attends Religious Services: More than 4 times per year    Active Member of Clubs or Organizations: Yes    Attends Archivist Meetings: More than 4 times per year    Marital Status: Never married  Intimate Partner Violence: Not At Risk (12/02/2021)   Humiliation, Afraid, Rape, and Kick questionnaire    Fear of Current or Ex-Partner: No    Emotionally Abused: No    Physically Abused: No    Sexually Abused: No    Past Surgical History:  Procedure Laterality Date   ABDOMINAL HYSTERECTOMY  2006   total   breast biopsy 2008 Right    benign   CATARACT EXTRACTION     07/05/17 (left eye) and 07/26/17 (right eye)   COLONOSCOPY WITH PROPOFOL N/A 03/30/2017   Procedure: COLONOSCOPY WITH PROPOFOL;  Surgeon: Milus Banister, MD;  Location: [facility];  Service: Endoscopy;  Laterality: N/A;   colonscopy  2008   OOPHORECTOMY     bilateral   SUPRANUMERARY NIPPLE EXCISION     right breast    Family History  Problem Relation Age of Onset   Heart disease Mother        CHF   Arthritis Mother    Diabetes Mother    COPD Father    Cancer Father        lung   Kidney disease Sister        kidney failure    Allergies  Allergen Reactions   Penicillins Shortness Of Breath, Itching and Rash    Has patient had a PCN reaction causing immediate rash, facial/tongue/throat swelling, SOB or  lightheadedness with hypotension: Yes Has patient had a PCN reaction causing severe rash involving mucus membranes or skin necrosis: Unknown Has patient had a PCN reaction that required hospitalization: No Has patient had a PCN reaction occurring within the last 10 years: No If all of the above answers are "NO", then may proceed with Cephalosporin use.   Shingrix [Zoster Vac Recomb Adjuvanted] Itching    Severe per patient    Current Outpatient Medications on File  Prior to Visit  Medication Sig Dispense Refill   acetaminophen (TYLENOL) 500 MG tablet Take 1 tablet (500 mg total) by mouth 3 (three) times daily. (Patient taking differently: Take 500 mg by mouth every 4 (four) hours as needed for mild pain.) 90 tablet prn   No current facility-administered medications on file prior to visit.    Pulse 73   Temp 98.3 F (36.8 C) (Oral)   Ht 4' 10.5" (1.486 m)   Wt 214 lb (97.1 kg)   SpO2 98%   BMI 43.96 kg/m       Objective:   Physical Exam Vitals and nursing note reviewed.  Constitutional:      General: She is not in acute distress.    Appearance: Normal appearance. She is obese. She is not ill-appearing.  HENT:     Head: Normocephalic and atraumatic.     Right Ear: Tympanic membrane, ear canal and external ear normal. There is no impacted cerumen.     Left Ear: Tympanic membrane, ear canal and external ear normal. There is no impacted cerumen.     Nose: Nose normal. No congestion or rhinorrhea.     Mouth/Throat:     Mouth: Mucous membranes are moist.     Pharynx: Oropharynx is clear.  Eyes:     Extraocular Movements: Extraocular movements intact.     Conjunctiva/sclera: Conjunctivae normal.     Pupils: Pupils are equal, round, and reactive to light.  Neck:     Vascular: No carotid bruit.  Cardiovascular:     Rate and Rhythm: Normal rate and regular rhythm.     Pulses: Normal pulses.     Heart sounds: No murmur heard.    No friction rub. No gallop.  Pulmonary:      Effort: Pulmonary effort is normal.     Breath sounds: Normal breath sounds.  Abdominal:     General: Abdomen is flat. Bowel sounds are normal. There is no distension.     Palpations: Abdomen is soft. There is no mass.     Tenderness: There is no abdominal tenderness. There is no guarding or rebound.     Hernia: No hernia is present.  Musculoskeletal:        General: Normal range of motion.     Cervical back: Normal range of motion and neck supple.  Lymphadenopathy:     Cervical: No cervical adenopathy.  Skin:    General: Skin is warm and dry.     Capillary Refill: Capillary refill takes less than 2 seconds.     Findings: Abscess and erythema present.     Comments: Abscess noted on left abdomen under skin fold. Abscess is non fluctuant. No active drainage   Neurological:     General: No focal deficit present.     Mental Status: She is alert and oriented to person, place, and time.  Psychiatric:        Mood and Affect: Mood normal.        Behavior: Behavior normal.        Thought Content: Thought content normal.        Judgment: Judgment normal.       Assessment & Plan:  1. Hyperlipidemia, unspecified hyperlipidemia type - Consider statin  - CBC with Differential/Platelet; Future - Comprehensive metabolic panel; Future - Lipid panel; Future  2. Primary osteoarthritis involving multiple joints - Continue with Tylenol  - CBC with Differential/Platelet; Future - Comprehensive metabolic panel; Future - Lipid panel; Future  3. Class 3  severe obesity due to excess calories without serious comorbidity with body mass index (BMI) of 40.0 to 44.9 in adult [facility]) - Encouraged to get back to exercising  - CBC with Differential/Platelet; Future - Comprehensive metabolic panel; Future - Lipid panel; Future  4. Need for immunization against influenza  - Flu Vaccine QUAD High Dose(Fluad)  5. Abscess - keep area clean and dry - Follow up if abscess starts draining - doxycycline  (VIBRAMYCIN) 100 MG capsule; Take 1 capsule (100 mg total) by mouth 2 (two) times daily.  Dispense: 20 capsule; Refill: 0

## 2023-09-22 ENCOUNTER — Telehealth: Payer: Self-pay | Admitting: Adult Health

## 2023-09-22 ENCOUNTER — Encounter: Payer: Self-pay | Admitting: Adult Health

## 2023-09-22 ENCOUNTER — Ambulatory Visit (INDEPENDENT_AMBULATORY_CARE_PROVIDER_SITE_OTHER): Payer: Medicare Other | Admitting: Adult Health

## 2023-09-22 VITALS — BP 120/78 | HR 74 | Temp 98.1°F | Ht 58.5 in | Wt 200.8 lb

## 2023-09-22 DIAGNOSIS — R110 Nausea: Secondary | ICD-10-CM | POA: Diagnosis not present

## 2023-09-22 DIAGNOSIS — R197 Diarrhea, unspecified: Secondary | ICD-10-CM | POA: Diagnosis not present

## 2023-09-22 DIAGNOSIS — Z23 Encounter for immunization: Secondary | ICD-10-CM

## 2023-09-22 LAB — CBC
HCT: 32.6 % — ABNORMAL LOW (ref 36.0–46.0)
Hemoglobin: 10.8 g/dL — ABNORMAL LOW (ref 12.0–15.0)
MCHC: 33.3 g/dL (ref 30.0–36.0)
MCV: 91.5 fL (ref 78.0–100.0)
Platelets: 198 10*3/uL (ref 150.0–400.0)
RBC: 3.56 Mil/uL — ABNORMAL LOW (ref 3.87–5.11)
RDW: 13.9 % (ref 11.5–15.5)
WBC: 5.3 10*3/uL (ref 4.0–10.5)

## 2023-09-22 LAB — COMPREHENSIVE METABOLIC PANEL
ALT: 9 U/L (ref 0–35)
AST: 17 U/L (ref 0–37)
Albumin: 4.2 g/dL (ref 3.5–5.2)
Alkaline Phosphatase: 70 U/L (ref 39–117)
BUN: 10 mg/dL (ref 6–23)
CO2: 25 meq/L (ref 19–32)
Calcium: 9.8 mg/dL (ref 8.4–10.5)
Chloride: 107 meq/L (ref 96–112)
Creatinine, Ser: 0.67 mg/dL (ref 0.40–1.20)
GFR: 85.8 mL/min (ref >60.00)
Glucose, Bld: 71 mg/dL (ref 70–99)
Potassium: 3.6 meq/L (ref 3.5–5.1)
Sodium: 140 meq/L (ref 135–145)
Total Bilirubin: 0.5 mg/dL (ref 0.2–1.2)
Total Protein: 8.4 g/dL — ABNORMAL HIGH (ref 6.0–8.3)

## 2023-09-22 MED ORDER — DICYCLOMINE HCL 20 MG PO TABS: 20.0000 mg | ORAL_TABLET | Freq: Three times a day (TID) | ORAL | 0 refills | Status: DC

## 2023-09-22 MED ORDER — ONDANSETRON HCL 4 MG PO TABS: 4.0000 mg | ORAL_TABLET | Freq: Three times a day (TID) | ORAL | 0 refills | Status: DC | PRN

## 2023-09-22 NOTE — Patient Instructions (Addendum)
It was great seeing you today and I am sorry you are feeling this badly.   I am going to do some blood work on you and have you complete a stool sample   I have sent in a medication called Bentyl to take three times a day and before meals to help alleviate the diarrhea   I have also sent in a prescription for Zofran to help with nausea.   I will follow up with you regarding your labs

## 2023-09-22 NOTE — Telephone Encounter (Signed)
Patient notified, verbalized understanding

## 2023-09-22 NOTE — Telephone Encounter (Signed)
Pt states she saw Kandee Keen this morning and forgot to ask if she is able to drink V8 juice. She is requesting a call back at [phone].

## 2023-09-22 NOTE — Progress Notes (Signed)
Subjective:    Patient ID: Markeitha Waechter, female    DOB: 1946/07/18, 77 y.o.   MRN: [id number]  HPI 77 year old female who  has a past medical history of Arthritis, Asthma, Borderline diabetes, Cataract, Diverticulosis, DVT of axillary vein, chronic (HCC) (2001), Glaucoma, History of DVT of lower extremity (1990s), Obesity, and Pneumonia.  She presents to the office today for an acute issue.   She reports that over the last two weeks she has had diarrhea whenever she eats or drinks anything. It started off that she would have intermittent diarrhea and over the last week the diarrhea has become constant after she eats or drinks anything. She has associated stomach cramping. She has not noticed any blood in the stool. She has tried pepto but feels as though this made it worse. She tried Imodium but this did not seem to help. She does not believe she has not eaten any foods that were under cooked.   She has nausea but no vomiting.  She has not had any fevers or chills.   She has felt dizzy over the last week    Review of Systems See HPI   Past Medical History:  Diagnosis Date   Arthritis    Asthma    Borderline diabetes    Cataract    both eyes   Diverticulosis    DVT of axillary vein, chronic (HCC) 2001   left leg    Glaucoma    History of DVT of lower extremity 1990s   Obesity    Pneumonia    as child    Social History   Socioeconomic History   Marital status: Divorced    Spouse name: Not on file   Number of children: 0   Years of education: 12   Highest education level: High school graduate  Occupational History   Occupation: Retired  Tobacco Use   Smoking status: Never   Smokeless tobacco: Never  Vaping Use   Vaping status: Never Used  Substance and Sexual Activity   Alcohol use: No   Drug use: No   Sexual activity: Not on file  Other Topics Concern   Not on file  Social History Narrative   Divorced for 16 years    No children    Lives with Niece &  nephew   Regular exercise: no   Caffeine Use:  1-2 drinks daily            Social Determinants of Health   Financial Resource Strain: Low Risk  (12/02/2021)   Overall Financial Resource Strain (CARDIA)    Difficulty of Paying Living Expenses: Not hard at all  Food Insecurity: No Food Insecurity (12/02/2021)   Hunger Vital Sign    Worried About Running Out of Food in the Last Year: Never true    Ran Out of Food in the Last Year: Never true  Transportation Needs: No Transportation Needs (12/02/2021)   PRAPARE - Administrator, Civil Service (Medical): No    Lack of Transportation (Non-Medical): No  Physical Activity: Inactive (12/02/2021)   Exercise Vital Sign    Days of Exercise per Week: 0 days    Minutes of Exercise per Session: 0 min  Stress: No Stress Concern Present (12/02/2021)   [employer] of Occupational Health - Occupational Stress Questionnaire    Feeling of Stress : Not at all  Social Connections: Moderately Integrated (12/02/2021)   Social Connection and Isolation Panel [NHANES]    Frequency of Communication  with Friends and Family: More than three times a week    Frequency of Social Gatherings with Friends and Family: More than three times a week    Attends Religious Services: More than 4 times per year    Active Member of Clubs or Organizations: Yes    Attends Banker Meetings: More than 4 times per year    Marital Status: Never married  Intimate Partner Violence: Not At Risk (12/02/2021)   Humiliation, Afraid, Rape, and Kick questionnaire    Fear of Current or Ex-Partner: No    Emotionally Abused: No    Physically Abused: No    Sexually Abused: No    Past Surgical History:  Procedure Laterality Date   ABDOMINAL HYSTERECTOMY  2006   total   breast biopsy 2008 Right    benign   CATARACT EXTRACTION     07/05/17 (left eye) and 07/26/17 (right eye)   COLONOSCOPY WITH PROPOFOL N/A 03/30/2017   Procedure: COLONOSCOPY WITH PROPOFOL;   Surgeon: Rachael Fee, MD;  Location: [facility];  Service: Endoscopy;  Laterality: N/A;   colonscopy  2008   OOPHORECTOMY     bilateral   SUPRANUMERARY NIPPLE EXCISION     right breast    Family History  Problem Relation Age of Onset   Heart disease Mother        CHF   Arthritis Mother    Diabetes Mother    COPD Father    Cancer Father        lung   Kidney disease Sister        kidney failure    Allergies  Allergen Reactions   Penicillins Shortness Of Breath, Itching and Rash    Has patient had a PCN reaction causing immediate rash, facial/tongue/throat swelling, SOB or lightheadedness with hypotension: Yes Has patient had a PCN reaction causing severe rash involving mucus membranes or skin necrosis: Unknown Has patient had a PCN reaction that required hospitalization: No Has patient had a PCN reaction occurring within the last 10 years: No If all of the above answers are "NO", then may proceed with Cephalosporin use.   Shingrix [Zoster Vac Recomb Adjuvanted] Itching    Severe per patient    Current Outpatient Medications on File Prior to Visit  Medication Sig Dispense Refill   acetaminophen (TYLENOL) 500 MG tablet Take 1 tablet (500 mg total) by mouth 3 (three) times daily. (Patient not taking: Reported on 09/22/2023) 90 tablet prn   doxycycline (VIBRAMYCIN) 100 MG capsule Take 1 capsule (100 mg total) by mouth 2 (two) times daily. (Patient not taking: Reported on 09/22/2023) 20 capsule 0   No current facility-administered medications on file prior to visit.    BP 120/78   Pulse 74   Temp 98.1 F (36.7 C) (Oral)   Ht 4' 10.5" (1.486 m)   Wt 200 lb 12.8 oz (91.1 kg)   SpO2 97%   BMI 41.25 kg/m       Objective:   Physical Exam Vitals and nursing note reviewed.  Constitutional:      Appearance: Normal appearance.  Eyes:     Extraocular Movements: Extraocular movements intact.     Conjunctiva/sclera: Conjunctivae normal.     Pupils: Pupils are equal,  round, and reactive to light.  Cardiovascular:     Rate and Rhythm: Regular rhythm.     Pulses: Normal pulses.     Heart sounds: Normal heart sounds.  Pulmonary:     Effort: Pulmonary effort is normal.  Breath sounds: Normal breath sounds.  Abdominal:     General: Abdomen is flat. Bowel sounds are normal.     Palpations: Abdomen is soft.     Tenderness: There is generalized abdominal tenderness. There is no right CVA tenderness or left CVA tenderness.  Musculoskeletal:        General: Normal range of motion.  Skin:    General: Skin is warm and dry.  Neurological:     General: No focal deficit present.     Mental Status: She is alert and oriented to person, place, and time.  Psychiatric:        Mood and Affect: Mood normal.        Behavior: Behavior normal.        Thought Content: Thought content normal.        Judgment: Judgment normal.        Assessment & Plan:  1. Diarrhea, unspecified type - Unknown cause. Possibly viral or bacterial gastroenteritis vs GERD vs IBD. Will check labs today including stool sample. I will send in Bentyl and Zofran.  - Consider referral to GI if not improving  - Encouraged bland diet for the next 72 hours and to stay hydrated  - CBC; Future - Comprehensive metabolic panel; Future - Stool culture; Future - dicyclomine (BENTYL) 20 MG tablet; Take 1 tablet (20 mg total) by mouth 3 (three) times daily before meals for 10 days.  Dispense: 30 tablet; Refill: 0  2. Nausea  - ondansetron (ZOFRAN) 4 MG tablet; Take 1 tablet (4 mg total) by mouth every 8 (eight) hours as needed for nausea or vomiting.  Dispense: 20 tablet; Refill: 0  3. Flu vaccine need  - Flu Vaccine Trivalent High Dose (Fluad)

## 2023-09-22 NOTE — Telephone Encounter (Signed)
Please advise 

## 2023-09-25 NOTE — Telephone Encounter (Signed)
Pt has questions regarding her stool sample kit

## 2023-09-26 NOTE — Telephone Encounter (Signed)
Pt stated she was able to speak with a representative at [employer]. PT questions were answered. No further action needed.

## 2023-09-27 DIAGNOSIS — R197 Diarrhea, unspecified: Secondary | ICD-10-CM | POA: Diagnosis not present

## 2023-09-28 ENCOUNTER — Encounter: Payer: Self-pay | Admitting: Adult Health

## 2023-09-28 ENCOUNTER — Ambulatory Visit: Payer: Medicare Other | Admitting: Adult Health

## 2023-09-28 VITALS — BP 110/80 | Temp 98.3°F | Ht 58.5 in | Wt 198.0 lb

## 2023-09-28 DIAGNOSIS — R197 Diarrhea, unspecified: Secondary | ICD-10-CM | POA: Diagnosis not present

## 2023-09-28 MED ORDER — CIPROFLOXACIN HCL 500 MG PO TABS: 500.0000 mg | ORAL_TABLET | Freq: Two times a day (BID) | ORAL | 0 refills | Status: AC

## 2023-09-28 NOTE — Progress Notes (Signed)
Subjective:    Patient ID: Carla Nash, female    DOB: Oct 01, 1946, 77 y.o.   MRN: [id number]  HPI  77 year old female who  has a past medical history of Arthritis, Asthma, Borderline diabetes, Cataract, Diverticulosis, DVT of axillary vein, chronic (HCC) (2001), Glaucoma, History of DVT of lower extremity (1990s), Obesity, and Pneumonia.  She presents to the office today for follow-up regarding diarrhea.  She was first seen about a week ago and at that time reported that she has had pretty constant diarrhea whenever she would eat or drink anything over the last 2 weeks.  She had associated stomach cramping.  Denied blood in her stool.  At home she had tried [facility] and felt that this made it worse.  She also tried Imodium but this did not seem to help.  She was not having any fevers, chills, or vomiting.  Her CBC and CMP are within normal limits.  I did order a stool culture and this was collected yesterday.  He was prescribed Bentyl 3 times daily before meals   Today she reports that the Bentyl did not help. She is eating a bland diet. She feels like her diarrhea is getting worse. She has a hard time making it to the bathroom.   She still denies fevers, chills, or blood in stool.   Review of Systems See HPI   Past Medical History:  Diagnosis Date   Arthritis    Asthma    Borderline diabetes    Cataract    both eyes   Diverticulosis    DVT of axillary vein, chronic (HCC) 2001   left leg    Glaucoma    History of DVT of lower extremity 1990s   Obesity    Pneumonia    as child    Social History   Socioeconomic History   Marital status: Divorced    Spouse name: Not on file   Number of children: 0   Years of education: 12   Highest education level: High school graduate  Occupational History   Occupation: Retired  Tobacco Use   Smoking status: Never   Smokeless tobacco: Never  Vaping Use   Vaping status: Never Used  Substance and Sexual Activity   Alcohol use: No    Drug use: No   Sexual activity: Not on file  Other Topics Concern   Not on file  Social History Narrative   Divorced for 16 years    No children    Lives with Niece & nephew   Regular exercise: no   Caffeine Use:  1-2 drinks daily            Social Determinants of Health   Financial Resource Strain: Low Risk  (12/02/2021)   Overall Financial Resource Strain (CARDIA)    Difficulty of Paying Living Expenses: Not hard at all  Food Insecurity: No Food Insecurity (12/02/2021)   Hunger Vital Sign    Worried About Running Out of Food in the Last Year: Never true    Ran Out of Food in the Last Year: Never true  Transportation Needs: No Transportation Needs (12/02/2021)   PRAPARE - Administrator, Civil Service (Medical): No    Lack of Transportation (Non-Medical): No  Physical Activity: Inactive (12/02/2021)   Exercise Vital Sign    Days of Exercise per Week: 0 days    Minutes of Exercise per Session: 0 min  Stress: No Stress Concern Present (12/02/2021)   [employer] of  Occupational Health - Occupational Stress Questionnaire    Feeling of Stress : Not at all  Social Connections: Moderately Integrated (12/02/2021)   Social Connection and Isolation Panel [NHANES]    Frequency of Communication with Friends and Family: More than three times a week    Frequency of Social Gatherings with Friends and Family: More than three times a week    Attends Religious Services: More than 4 times per year    Active Member of Clubs or Organizations: Yes    Attends Banker Meetings: More than 4 times per year    Marital Status: Never married  Intimate Partner Violence: Not At Risk (12/02/2021)   Humiliation, Afraid, Rape, and Kick questionnaire    Fear of Current or Ex-Partner: No    Emotionally Abused: No    Physically Abused: No    Sexually Abused: No    Past Surgical History:  Procedure Laterality Date   ABDOMINAL HYSTERECTOMY  2006   total   breast biopsy 2008  Right    benign   CATARACT EXTRACTION     07/05/17 (left eye) and 07/26/17 (right eye)   COLONOSCOPY WITH PROPOFOL N/A 03/30/2017   Procedure: COLONOSCOPY WITH PROPOFOL;  Surgeon: Rachael Fee, MD;  Location: [facility];  Service: Endoscopy;  Laterality: N/A;   colonscopy  2008   OOPHORECTOMY     bilateral   SUPRANUMERARY NIPPLE EXCISION     right breast    Family History  Problem Relation Age of Onset   Heart disease Mother        CHF   Arthritis Mother    Diabetes Mother    COPD Father    Cancer Father        lung   Kidney disease Sister        kidney failure    Allergies  Allergen Reactions   Penicillins Shortness Of Breath, Itching and Rash    Has patient had a PCN reaction causing immediate rash, facial/tongue/throat swelling, SOB or lightheadedness with hypotension: Yes Has patient had a PCN reaction causing severe rash involving mucus membranes or skin necrosis: Unknown Has patient had a PCN reaction that required hospitalization: No Has patient had a PCN reaction occurring within the last 10 years: No If all of the above answers are "NO", then may proceed with Cephalosporin use.   Shingrix [Zoster Vac Recomb Adjuvanted] Itching    Severe per patient    Current Outpatient Medications on File Prior to Visit  Medication Sig Dispense Refill   acetaminophen (TYLENOL) 500 MG tablet Take 1 tablet (500 mg total) by mouth 3 (three) times daily. 90 tablet prn   dicyclomine (BENTYL) 20 MG tablet Take 1 tablet (20 mg total) by mouth 3 (three) times daily before meals for 10 days. 30 tablet 0   doxycycline (VIBRAMYCIN) 100 MG capsule Take 1 capsule (100 mg total) by mouth 2 (two) times daily. 20 capsule 0   ondansetron (ZOFRAN) 4 MG tablet Take 1 tablet (4 mg total) by mouth every 8 (eight) hours as needed for nausea or vomiting. 20 tablet 0   No current facility-administered medications on file prior to visit.    BP 110/80   Temp 98.3 F (36.8 C) (Oral)   Ht 4' 10.5"  (1.486 m)   Wt 198 lb (89.8 kg)   BMI 40.68 kg/m       Objective:   Physical Exam Vitals and nursing note reviewed.  Constitutional:      Appearance: Normal appearance.  Cardiovascular:     Rate and Rhythm: Normal rate and regular rhythm.     Pulses: Normal pulses.     Heart sounds: Normal heart sounds.  Pulmonary:     Effort: Pulmonary effort is normal.     Breath sounds: Normal breath sounds.  Abdominal:     General: Abdomen is flat. Bowel sounds are normal.     Palpations: Abdomen is soft.     Tenderness: There is abdominal tenderness.  Skin:    General: Skin is warm and dry.  Neurological:     General: No focal deficit present.     Mental Status: She is alert and oriented to person, place, and time.  Psychiatric:        Mood and Affect: Mood normal.        Behavior: Behavior normal.        Thought Content: Thought content normal.        Judgment: Judgment normal.        Assessment & Plan:  1. Diarrhea, unspecified type -Her symptoms are worsening.  Will start her on empiric Cipro while we await stool culture results.  If no improvement then will refer to GI.  Continue with diet and diet. - ciprofloxacin (CIPRO) 500 MG tablet; Take 1 tablet (500 mg total) by mouth 2 (two) times daily for 7 days.  Dispense: 14 tablet; Refill: 0   Shirline Frees, NP  Time spent with patient today was 31 minutes which consisted of chart review, discussing diarrhea, work up, treatment answering questions, listening and documentation.

## 2023-09-28 NOTE — Patient Instructions (Signed)
I am going to send in antibiotics for you to see if that helps with the diarrhea   I will follow up with you regarding your stool culture once I get it back

## 2023-10-03 ENCOUNTER — Other Ambulatory Visit: Payer: Self-pay | Admitting: Adult Health

## 2023-10-04 LAB — STOOL CULTURE: E coli, Shiga toxin Assay: NEGATIVE

## 2023-10-16 ENCOUNTER — Other Ambulatory Visit: Payer: Self-pay | Admitting: Obstetrics and Gynecology

## 2023-10-16 DIAGNOSIS — Z01419 Encounter for gynecological examination (general) (routine) without abnormal findings: Secondary | ICD-10-CM | POA: Diagnosis not present

## 2023-10-16 DIAGNOSIS — Z1231 Encounter for screening mammogram for malignant neoplasm of breast: Secondary | ICD-10-CM

## 2023-11-29 ENCOUNTER — Ambulatory Visit: Payer: Medicare Other

## 2023-12-20 ENCOUNTER — Ambulatory Visit
Admission: RE | Admit: 2023-12-20 | Discharge: 2023-12-20 | Disposition: A | Discharge status: Home / Self Care | Payer: Medicare Other | Source: Ambulatory Visit | Attending: Obstetrics and Gynecology | Admitting: Obstetrics and Gynecology

## 2023-12-20 DIAGNOSIS — Z1231 Encounter for screening mammogram for malignant neoplasm of breast: Secondary | ICD-10-CM | POA: Diagnosis not present

## 2024-01-09 ENCOUNTER — Encounter: Payer: Self-pay | Admitting: Adult Health

## 2024-01-09 ENCOUNTER — Other Ambulatory Visit: Payer: Self-pay | Admitting: Adult Health

## 2024-01-09 ENCOUNTER — Ambulatory Visit (INDEPENDENT_AMBULATORY_CARE_PROVIDER_SITE_OTHER): Payer: Medicare Other | Admitting: Adult Health

## 2024-01-09 VITALS — BP 110/80 | HR 59 | Temp 97.7°F | Ht <= 58 in | Wt 188.8 lb

## 2024-01-09 DIAGNOSIS — Z6841 Body Mass Index (BMI) 40.0 and over, adult: Secondary | ICD-10-CM | POA: Diagnosis not present

## 2024-01-09 DIAGNOSIS — M150 Primary generalized (osteo)arthritis: Secondary | ICD-10-CM

## 2024-01-09 DIAGNOSIS — E66813 Obesity, class 3: Secondary | ICD-10-CM

## 2024-01-09 DIAGNOSIS — E785 Hyperlipidemia, unspecified: Secondary | ICD-10-CM | POA: Diagnosis not present

## 2024-01-09 LAB — LIPID PANEL
Cholesterol: 200 mg/dL (ref 0–200)
HDL: 44.1 mg/dL (ref >39.00)
LDL Cholesterol: 133 mg/dL — ABNORMAL HIGH (ref 0–99)
NonHDL: 155.72
Total CHOL/HDL Ratio: 5
Triglycerides: 115 mg/dL (ref 0.0–149.0)
VLDL: 23 mg/dL (ref 0.0–40.0)

## 2024-01-09 LAB — COMPREHENSIVE METABOLIC PANEL
ALT: 17 U/L (ref 0–35)
AST: 22 U/L (ref 0–37)
Albumin: 4.3 g/dL (ref 3.5–5.2)
Alkaline Phosphatase: 68 U/L (ref 39–117)
BUN: 14 mg/dL (ref 6–23)
CO2: 26 meq/L (ref 19–32)
Calcium: 9.9 mg/dL (ref 8.4–10.5)
Chloride: 103 meq/L (ref 96–112)
Creatinine, Ser: 0.84 mg/dL (ref 0.40–1.20)
GFR: 68.07 mL/min (ref >60.00)
Glucose, Bld: 81 mg/dL (ref 70–99)
Potassium: 4.3 meq/L (ref 3.5–5.1)
Sodium: 136 meq/L (ref 135–145)
Total Bilirubin: 0.5 mg/dL (ref 0.2–1.2)
Total Protein: 8.5 g/dL — ABNORMAL HIGH (ref 6.0–8.3)

## 2024-01-09 LAB — CBC WITH DIFFERENTIAL/PLATELET
Basophils Absolute: 0 10*3/uL (ref 0.0–0.1)
Basophils Relative: 0.3 % (ref 0.0–3.0)
Eosinophils Absolute: 0.2 10*3/uL (ref 0.0–0.7)
Eosinophils Relative: 3.8 % (ref 0.0–5.0)
HCT: 31 % — ABNORMAL LOW (ref 36.0–46.0)
Hemoglobin: 10.2 g/dL — ABNORMAL LOW (ref 12.0–15.0)
Lymphocytes Relative: 54.5 % — ABNORMAL HIGH (ref 12.0–46.0)
Lymphs Abs: 3.2 10*3/uL (ref 0.7–4.0)
MCHC: 33 g/dL (ref 30.0–36.0)
MCV: 91.7 fl (ref 78.0–100.0)
Monocytes Absolute: 0.4 10*3/uL (ref 0.1–1.0)
Monocytes Relative: 6.2 % (ref 3.0–12.0)
Neutro Abs: 2.1 10*3/uL (ref 1.4–7.7)
Neutrophils Relative %: 35.2 % — ABNORMAL LOW (ref 43.0–77.0)
Platelets: 244 10*3/uL (ref 150.0–400.0)
RBC: 3.38 Mil/uL — ABNORMAL LOW (ref 3.87–5.11)
RDW: 15.2 % (ref 11.5–15.5)
WBC: 5.9 10*3/uL (ref 4.0–10.5)

## 2024-01-09 LAB — TSH: TSH: 3.87 u[IU]/mL (ref 0.35–5.50)

## 2024-01-09 LAB — VITAMIN D 25 HYDROXY (VIT D DEFICIENCY, FRACTURES): VITD: <7 ng/mL — ABNORMAL LOW (ref 30.00–100.00)

## 2024-01-09 MED ORDER — VITAMIN D (ERGOCALCIFEROL) 1.25 MG (50000 UNIT) PO CAPS: 50000.0000 [IU] | ORAL_CAPSULE | ORAL | 1 refills | Status: AC

## 2024-01-09 NOTE — Patient Instructions (Addendum)
 It was great seeing you today! I think you are doing great!  We will follow up with you regarding your lab work   Please let me know if you need anything   Please get your shingles vaccinations. This can be done at a pharmacy.

## 2024-01-09 NOTE — Progress Notes (Signed)
 Subjective:    Patient ID: Danijah Noh, female    DOB: 1946/06/06, 78 y.o.   MRN: [id number]  HPI Patient presents for yearly preventative medicine examination. She is a pleasant 78 year old female who  has a past medical history of Arthritis, Asthma, Borderline diabetes, Cataract, Diverticulosis, DVT of axillary vein, chronic (HCC) (2001), Glaucoma, History of DVT of lower extremity (1990s), Obesity, and Pneumonia.  Hyperlipidemia -not currently on any medication, she has been working on lifestyle modifications  Lab Results  Component Value Date   CHOL 158 01/04/2023   HDL 46.20 01/04/2023   LDLCALC 90 01/04/2023   LDLDIRECT 172.9 02/21/2012   TRIG 107.0 01/04/2023   CHOLHDL 3 01/04/2023    Obesity -She has been actively working on weight loss with healthy eating. She has not been exercising much due to the cold weight but she has been able to lose about 26 pounds and feels " a lot better". She is no longer having diarrhea which we saw her for in November.  Wt Readings from Last 10 Encounters:  01/09/24 188 lb 12.8 oz (85.6 kg)  09/28/23 198 lb (89.8 kg)  09/22/23 200 lb 12.8 oz (91.1 kg)  01/04/23 214 lb (97.1 kg)  01/03/23 199 lb (90.3 kg)  12/02/21 199 lb (90.3 kg)  10/19/21 199 lb (90.3 kg)  10/15/20 224 lb 3.2 oz (101.7 kg)  09/17/20 225 lb 3.2 oz (102.2 kg)  12/03/19 231 lb 6.4 oz (105 kg)    Osteoarthritis -chronic pain in her knees and low back.  She does take Tylenol as needed. With her weight loss she has less pain.  Does not want anything else done at this time.   All immunizations and health maintenance protocols were reviewed with the patient and needed orders were placed. Advised to get shingles and Tdap at pharmacy   Appropriate screening laboratory values were ordered for the patient including screening of hyperlipidemia, renal function and hepatic function.   Medication reconciliation,  past medical history, social history, problem list and allergies  were reviewed in detail with the patient  Goals were established with regard to weight loss, exercise, and  diet in compliance with medications  Review of Systems  Constitutional: Negative.   HENT: Negative.    Eyes: Negative.   Respiratory: Negative.    Cardiovascular: Negative.   Gastrointestinal: Negative.   Endocrine: Negative.   Genitourinary: Negative.   Musculoskeletal:  Positive for back pain and gait problem.  Skin: Negative.   Allergic/Immunologic: Negative.   Hematological: Negative.   Psychiatric/Behavioral: Negative.     Past Medical History:  Diagnosis Date   Arthritis    Asthma    Borderline diabetes    Cataract    both eyes   Diverticulosis    DVT of axillary vein, chronic (HCC) 2001   left leg    Glaucoma    History of DVT of lower extremity 1990s   Obesity    Pneumonia    as child    Social History   Socioeconomic History   Marital status: Divorced    Spouse name: Not on file   Number of children: 0   Years of education: 12   Highest education level: High school graduate  Occupational History   Occupation: Retired  Tobacco Use   Smoking status: Never   Smokeless tobacco: Never  Vaping Use   Vaping status: Never Used  Substance and Sexual Activity   Alcohol use: No   Drug use: No  Sexual activity: Not on file  Other Topics Concern   Not on file  Social History Narrative   Divorced for 16 years    No children    Lives with Niece & nephew   Regular exercise: no   Caffeine Use:  1-2 drinks daily            Social Drivers of Corporate investment banker Strain: Low Risk  (12/02/2021)   Overall Financial Resource Strain (CARDIA)    Difficulty of Paying Living Expenses: Not hard at all  Food Insecurity: No Food Insecurity (12/02/2021)   Hunger Vital Sign    Worried About Running Out of Food in the Last Year: Never true    Ran Out of Food in the Last Year: Never true  Transportation Needs: No Transportation Needs (12/02/2021)    PRAPARE - Administrator, Civil Service (Medical): No    Lack of Transportation (Non-Medical): No  Physical Activity: Inactive (12/02/2021)   Exercise Vital Sign    Days of Exercise per Week: 0 days    Minutes of Exercise per Session: 0 min  Stress: No Stress Concern Present (12/02/2021)   [employer] of Occupational Health - Occupational Stress Questionnaire    Feeling of Stress : Not at all  Social Connections: Moderately Integrated (12/02/2021)   Social Connection and Isolation Panel [NHANES]    Frequency of Communication with Friends and Family: More than three times a week    Frequency of Social Gatherings with Friends and Family: More than three times a week    Attends Religious Services: More than 4 times per year    Active Member of Clubs or Organizations: Yes    Attends Banker Meetings: More than 4 times per year    Marital Status: Never married  Intimate Partner Violence: Not At Risk (12/02/2021)   Humiliation, Afraid, Rape, and Kick questionnaire    Fear of Current or Ex-Partner: No    Emotionally Abused: No    Physically Abused: No    Sexually Abused: No    Past Surgical History:  Procedure Laterality Date   ABDOMINAL HYSTERECTOMY  2006   total   breast biopsy 2008 Right    benign   BREAST EXCISIONAL BIOPSY Left    nipple excision   BREAST EXCISIONAL BIOPSY Right    CATARACT EXTRACTION     07/05/17 (left eye) and 07/26/17 (right eye)   COLONOSCOPY WITH PROPOFOL N/A 03/30/2017   Procedure: COLONOSCOPY WITH PROPOFOL;  Surgeon: Rachael Fee, MD;  Location: [facility];  Service: Endoscopy;  Laterality: N/A;   colonscopy  2008   OOPHORECTOMY     bilateral   SUPRANUMERARY NIPPLE EXCISION     right breast    Family History  Problem Relation Age of Onset   Heart disease Mother        CHF   Arthritis Mother    Diabetes Mother    COPD Father    Cancer Father        lung   Kidney disease Sister        kidney failure     Allergies  Allergen Reactions   Penicillins Shortness Of Breath, Itching and Rash    Has patient had a PCN reaction causing immediate rash, facial/tongue/throat swelling, SOB or lightheadedness with hypotension: Yes Has patient had a PCN reaction causing severe rash involving mucus membranes or skin necrosis: Unknown Has patient had a PCN reaction that required hospitalization: No Has patient had a  PCN reaction occurring within the last 10 years: No If all of the above answers are "NO", then may proceed with Cephalosporin use.   Shingrix [Zoster Vac Recomb Adjuvanted] Itching    Severe per patient    Current Outpatient Medications on File Prior to Visit  Medication Sig Dispense Refill   acetaminophen (TYLENOL) 500 MG tablet Take 1 tablet (500 mg total) by mouth 3 (three) times daily. 90 tablet prn   ondansetron (ZOFRAN) 4 MG tablet Take 1 tablet (4 mg total) by mouth every 8 (eight) hours as needed for nausea or vomiting. 20 tablet 0   No current facility-administered medications on file prior to visit.    BP 110/80   Pulse (!) 59   Temp 97.7 F (36.5 C) (Oral)   Ht 4\' 10"  (1.473 m)   Wt 188 lb 12.8 oz (85.6 kg)   SpO2 100%   BMI 39.46 kg/m       Objective:   Physical Exam Vitals and nursing note reviewed.  Constitutional:      General: She is not in acute distress.    Appearance: Normal appearance. She is obese. She is not ill-appearing.  HENT:     Head: Normocephalic and atraumatic.     Right Ear: Tympanic membrane, ear canal and external ear normal. There is no impacted cerumen.     Left Ear: Tympanic membrane, ear canal and external ear normal. There is no impacted cerumen.     Nose: Nose normal. No congestion or rhinorrhea.     Mouth/Throat:     Mouth: Mucous membranes are moist.     Pharynx: Oropharynx is clear.  Eyes:     Extraocular Movements: Extraocular movements intact.     Conjunctiva/sclera: Conjunctivae normal.     Pupils: Pupils are equal, round,  and reactive to light.  Neck:     Vascular: No carotid bruit.  Cardiovascular:     Rate and Rhythm: Normal rate and regular rhythm.     Pulses: Normal pulses.     Heart sounds: No murmur heard.    No friction rub. No gallop.  Pulmonary:     Effort: Pulmonary effort is normal.     Breath sounds: Normal breath sounds.  Abdominal:     General: Abdomen is flat. Bowel sounds are normal. There is no distension.     Palpations: Abdomen is soft. There is no mass.     Tenderness: There is no abdominal tenderness. There is no guarding or rebound.     Hernia: No hernia is present.  Musculoskeletal:        General: Normal range of motion.     Cervical back: Normal range of motion and neck supple.  Lymphadenopathy:     Cervical: No cervical adenopathy.  Skin:    General: Skin is warm and dry.     Capillary Refill: Capillary refill takes less than 2 seconds.  Neurological:     General: No focal deficit present.     Mental Status: She is alert and oriented to person, place, and time.  Psychiatric:        Mood and Affect: Mood normal.        Behavior: Behavior normal.        Thought Content: Thought content normal.        Judgment: Judgment normal.        Assessment & Plan:  1. Hyperlipidemia, unspecified hyperlipidemia type (Primary) - Consider statin - CBC with Differential/Platelet; Future - Comprehensive metabolic panel; Future -  Lipid panel; Future - TSH; Future  2. Primary osteoarthritis involving multiple joints - Continue with OTC medication  - CBC with Differential/Platelet; Future - Comprehensive metabolic panel; Future - Lipid panel; Future - TSH; Future  3. Class 3 severe obesity due to excess calories without serious comorbidity with body mass index (BMI) of 40.0 to 44.9 in adult [facility]) - She plans on getting back into exercising and eating healthy  - CBC with Differential/Platelet; Future - Comprehensive metabolic panel; Future - Lipid panel; Future - TSH;  Future - VITAMIN D 25 Hydroxy (Vit-D Deficiency, Fractures); Future  Shirline Frees, NP

## 2024-01-12 ENCOUNTER — Other Ambulatory Visit: Payer: Self-pay

## 2024-01-12 MED ORDER — ATORVASTATIN CALCIUM 10 MG PO TABS: 10.0000 mg | ORAL_TABLET | Freq: Every day | ORAL | 2 refills | Status: AC

## 2024-01-16 ENCOUNTER — Telehealth: Payer: Self-pay | Admitting: Adult Health

## 2024-01-16 NOTE — Telephone Encounter (Signed)
 Copied from CRM [id number]. Topic: General - Other >> Jan 16, 2024 11:22 AM Florestine Avers wrote: Reason for CRM: Requesting call back, had missed call and unsure as to who called her.

## 2024-01-16 NOTE — Telephone Encounter (Signed)
 Unsure who called the patient.

## 2024-06-04 ENCOUNTER — Ambulatory Visit (INDEPENDENT_AMBULATORY_CARE_PROVIDER_SITE_OTHER): Admitting: Family Medicine

## 2024-06-04 DIAGNOSIS — Z0000 Encounter for general adult medical examination without abnormal findings: Secondary | ICD-10-CM | POA: Diagnosis not present

## 2024-06-04 NOTE — Progress Notes (Signed)
 PATIENT CHECK-IN and HEALTH RISK ASSESSMENT QUESTIONNAIRE:  -completed by phone/video for upcoming Medicare Preventive Visit  Pre-Visit Check-in: 1)Vitals (height, wt, BP, etc) - record in vitals section for visit on day of visit Request home vitals (wt, BP, etc.) and enter into vitals, THEN update Vital Signs SmartPhrase below at the top of the HPI. See below.  2)Review and Update Medications, Allergies PMH, Surgeries, Social history in Epic 3)Hospitalizations in the last year with date/reason? n  4)Review and Update Care Team (patient's specialists) in Epic 5) Complete PHQ9 in Epic  6) Complete Fall Screening in Epic 7)Review all Health Maintenance Due and order if not done.  Medicare Wellness Patient Questionnaire:  Answer theses question about your habits: How often do you have a drink containing alcohol?n How many drinks containing alcohol do you have on a typical day when you are drinking?n How often do you have six or more drinks on one occasion?na Have you ever smoked?n Quit date if applicable? na  How many packs a day do/did you smoke? n Do you use smokeless tobacco?n Do you use an illicit drugs?n On average, how many days per week do you engage in moderate to strenuous exercise (like a brisk walk)?has been too hot lately On average, how many minutes do you engage in exercise at this level?  Typical diet: friend has been fixing food for her - she has been fixing healthy foods, she can't eat veggies, avoids sweets and snacks.  Beverages: one soda daily, lots of water  Answer theses question about your everyday activities: Can you perform most household chores?her friend does help her a lot Are you deaf or have significant trouble hearing?n Do you feel that you have a problem with memory?n Do you feel safe at home?y Last dentist visit?does not go 8. Do you have any difficulty performing your everyday activities?friend helps Are you having any difficulty walking, taking  medications on your own, and or difficulty managing daily home needs?n Do you have difficulty walking or climbing stairs?n Do you have difficulty dressing or bathing?n Do you have difficulty doing errands alone such as visiting a doctor's office or shopping?friend takes her to appointments Do you currently have any difficulty preparing food and eating?n Do you currently have any difficulty using the toilet?n Do you have any difficulty managing your finances?n Do you have any difficulties with housekeeping of managing your housekeeping?friend helps   Do you have Advanced Directives in place (Living Will, Healthcare Power or Attorney)? y   Last eye Exam and location? Goes to [facility] once a year   Do you currently use prescribed or non-prescribed narcotic or opioid pain medications?n  Do you have a history or close family history of breast, ovarian, tubal or peritoneal cancer or a family member with BRCA (breast cancer susceptibility 1 and 2) gene mutations? FH breast cancer    ----------------------------------------------------------------------------------------------------------------------------------------------------------------------------------------------------------------------  Because this visit was a virtual/telehealth visit, some criteria may be missing or patient reported. Any vitals not documented were not able to be obtained and vitals that have been documented are patient reported.    MEDICARE ANNUAL PREVENTIVE VISIT WITH PROVIDER: (Welcome to Medicare, initial annual wellness or annual wellness exam)  Virtual Visit via Phone Note  I connected with Carla Nash on 06/04/24 by phone and verified that I am speaking with the correct person using two identifiers. She prefers to talk by phone.   Location patient: home Location provider:work or home office Persons participating in the virtual visit: patient, provider  Concerns and/or follow up today: reports is doing  well, no new concerns.    See HM section in Epic for other details of completed HM.    ROS: negative for report of fevers, unintentional weight loss, vision changes, vision loss, hearing loss or change, chest pain, sob, hemoptysis, melena, hematochezia, hematuria, falls, bleeding or bruising, thoughts of suicide or self harm, memory loss  Patient-completed extensive health risk assessment - reviewed and discussed with the patient: See Health Risk Assessment completed with patient prior to the visit either above or in recent phone note. This was reviewed in detailed with the patient today and appropriate recommendations, orders and referrals were placed as needed per Summary below and patient instructions.   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    Glaucoma    History of DVT of lower extremity 1990s   Obesity    Pneumonia    as child    Past Surgical History:  Procedure Laterality Date   ABDOMINAL HYSTERECTOMY  2006   total   breast biopsy 2008 Right    benign   BREAST EXCISIONAL BIOPSY Left    nipple excision   BREAST EXCISIONAL BIOPSY Right    CATARACT EXTRACTION     07/05/17 (left eye) and 07/26/17 (right eye)   COLONOSCOPY WITH PROPOFOL  N/A 03/30/2017   Procedure: COLONOSCOPY WITH PROPOFOL ;  Surgeon: Teressa Toribio SQUIBB, MD;  Location: [facility];  Service: Endoscopy;  Laterality: N/A;   colonscopy  2008   OOPHORECTOMY     bilateral   SUPRANUMERARY NIPPLE EXCISION     right breast    Social History   Socioeconomic History   Marital status:  Divorced    Spouse name: Not on file   Number of children: 0   Years of education: 12   Highest education level: High school graduate  Occupational History   Occupation: Retired  Tobacco Use   Smoking status: Never   Smokeless tobacco: Never  Vaping Use   Vaping status: Never Used  Substance and Sexual Activity   Alcohol use: No   Drug use: No   Sexual activity: Not on file  Other Topics Concern   Not on file  Social History Narrative   Divorced for 16 years    No children    Lives with Niece & nephew   Regular exercise: no   Caffeine Use:  1-2 drinks daily            Social Drivers of Corporate investment banker Strain: Low Risk  (12/02/2021)   Overall Financial Resource Strain (CARDIA)    Difficulty of Paying Living Expenses: Not hard at all  Food Insecurity: No Food Insecurity (12/02/2021)   Hunger Vital Sign    Worried About Running Out of Food in the Last Year: Never true    Ran Out of Food in the Last Year: Never true  Transportation Needs: No Transportation Needs (12/02/2021)   PRAPARE - Transportation    Lack of Transportation (Medical): No    Lack  of Transportation (Non-Medical): No  Physical Activity: Inactive (12/02/2021)   Exercise Vital Sign    Days of Exercise per Week: 0 days    Minutes of Exercise per Session: 0 min  Stress: No Stress Concern Present (12/02/2021)   [employer] of Occupational Health - Occupational Stress Questionnaire    Feeling of Stress : Not at all  Social Connections: Moderately Integrated (12/02/2021)   Social Connection and Isolation Panel    Frequency of Communication with Friends and Family: More than three times a week    Frequency of Social Gatherings with Friends and Family: More than three times a week    Attends Religious Services: More than 4 times per year    Active Member of [employer] or Organizations: Yes    Attends Banker Meetings: More than 4 times per year    Marital Status: Never married  Intimate  Partner Violence: Not At Risk (12/02/2021)   Humiliation, Afraid, Rape, and Kick questionnaire    Fear of Current or Ex-Partner: No    Emotionally Abused: No    Physically Abused: No    Sexually Abused: No    Family History  Problem Relation Age of Onset   Heart disease Mother        CHF   Arthritis Mother    Diabetes Mother    COPD Father    Cancer Father        lung   Kidney disease Sister        kidney failure    Current Outpatient Medications on File Prior to Visit  Medication Sig Dispense Refill   atorvastatin  (LIPITOR) 10 MG tablet Take 1 tablet (10 mg total) by mouth daily. 90 tablet 2   No current facility-administered medications on file prior to visit.    Allergies  Allergen Reactions   Penicillins Shortness Of Breath, Itching and Rash    Has patient had a PCN reaction causing immediate rash, facial/tongue/throat swelling, SOB or lightheadedness with hypotension: Yes Has patient had a PCN reaction causing severe rash involving mucus membranes or skin necrosis: Unknown Has patient had a PCN reaction that required hospitalization: No Has patient had a PCN reaction occurring within the last 10 years: No If all of the above answers are NO, then may proceed with Cephalosporin use.   Shingrix [Zoster Vac Recomb Adjuvanted] Itching    Severe per patient       Physical Exam Vitals requested from patient and listed below if patient had equipment and was able to obtain at home for this virtual visit: There were no vitals filed for this visit. Estimated body mass index is 39.46 kg/m as calculated from the following:   Height as of 01/09/24: 4' 10 (1.473 m).   Weight as of 01/09/24: 188 lb 12.8 oz (85.6 kg).  EKG (optional): deferred due to virtual visit  GENERAL: alert, oriented, no acute distress detected, full vision exam deferred due to pandemic and/or virtual encounter  PSYCH/NEURO: pleasant and cooperative, no obvious depression or anxiety, speech and thought  processing grossly intact, Cognitive function grossly intact  Flowsheet Row Office Visit from [facility]/04/2023 in [facility]  PHQ-9 Total Score 0        06/04/2024   10:12 AM 09/22/2023    8:37 AM 01/04/2023    9:18 AM 01/03/2023    9:22 AM 12/02/2021    9:49 AM  Depression screen PHQ 2/9  Decreased Interest 0 0 0 0 0  Down, Depressed, Hopeless  0 0 0 0 0  PHQ - 2 Score 0 0 0 0 0  Altered sleeping   0    Tired, decreased energy   0    Change in appetite   0    Feeling bad or failure about yourself    0    Trouble concentrating   0    Moving slowly or fidgety/restless   0    Suicidal thoughts   0    PHQ-9 Score   0    Difficult doing work/chores   Not difficult at all         01/03/2023    9:41 AM 01/04/2023    9:18 AM 09/22/2023    8:37 AM 01/09/2024    9:26 AM 06/04/2024   10:07 AM  Fall Risk  Falls in the past year? 0 0 0 0 1  Was there an injury with Fall? 0 0 0 0 0  Fall Risk Category Calculator 0 0 0 0 1  Patient at Risk for Falls Due to No Fall Risks History of fall(s) Impaired balance/gait    Patient at Risk for Falls Due to - Comments   walk with a cane    Fall risk Follow up Falls evaluation completed Falls evaluation completed  Falls evaluation completed Falls evaluation completed;Education provided     SUMMARY AND PLAN:  Encounter for Medicare annual wellness exam  Discussed applicable health maintenance/preventive health measures and advised and referred or ordered per patient preferences: -she declines the shingles shot -she reports she prefers to ask her gynecologist about the bone density test, declines today -she is considering the Tdap and the covid and flu shots in the fall, knows can get at the pharmacy. Advised to let us  know if gets any shots so that we can update her chart  Health Maintenance  Topic Date Due   COVID-19 Vaccine (6 - 2024-25 season) 07/09/2023   DTaP/Tdap/Td (3 - Td or Tdap) 01/08/2024   Zoster Vaccines-  Shingrix (1 of 2) 09/04/2024 (Originally [birth year]/21/1998)   INFLUENZA VACCINE  06/07/2024   Medicare Annual Wellness (AWV)  06/04/2025   Colonoscopy  03/31/2027   Pneumococcal Vaccine: 50+ Years  Completed   DEXA SCAN  Completed   Hepatitis C Screening  Completed   Hepatitis B Vaccines  Aged Out   HPV VACCINES  Aged Out   Meningococcal B Vaccine  Aged Out      Education and counseling on the following was provided based on the above review of health and a plan/checklist for the patient, along with additional information discussed, was provided for the patient in the patient instructions :  -Provided counseling and plan for increased risk of falling if applicable per above screening. Discussed safe balance exercises that can be done at home to improve balance and discussed exercise guidelines for adults with include balance exercises at least 3 days per week.  -Advised and counseled on a healthy lifestyle  -Reviewed patient's current diet. Advised and counseled on a whole foods based healthy diet. A summary of a healthy diet was provided in the Patient Instructions, she feels she can not eat vegetables. -reviewed patient's current physical activity level and discussed exercise guidelines for adults. Discussed  safe exercise at home to assist in meeting exercise guideline recommendations in a safe and healthy way.  -Advise yearly dental visits at minimum and regular eye exams   Follow up: see patient instructions     Patient Instructions  I really enjoyed getting to  talk with you today! I am available on Tuesdays and Thursdays for virtual visits if you have any questions or concerns, or if I can be of any further assistance.   CHECKLIST FROM ANNUAL WELLNESS VISIT:  -Follow up (please call to schedule if not scheduled after visit):   -yearly for annual wellness visit with primary care office  Here is a list of your preventive care/health maintenance measures and the plan for each if any  are due:  PLAN For any measures below that may be due:    1. Can get Tdap, covid and flu shots at the pharmacy if you wish. Please get a copy of receipt to bring to [facility] so that we can update your record.   2. Please let us  know if you wish to do the bone density test.   Health Maintenance  Topic Date Due   COVID-19 Vaccine (6 - 2024-25 season) 07/09/2023   DTaP/Tdap/Td (3 - Td or Tdap) 01/08/2024   Zoster Vaccines- Shingrix (1 of 2) 09/04/2024 (Originally 10/21/1998)   INFLUENZA VACCINE  06/07/2024   Medicare Annual Wellness (AWV)  06/04/2025   Colonoscopy  03/31/2027   Pneumococcal Vaccine: 50+ Years  Completed   DEXA SCAN  Completed   Hepatitis C Screening  Completed   Hepatitis B Vaccines  Aged Out   HPV VACCINES  Aged Out   Meningococcal B Vaccine  Aged Out    -See a dentist at least yearly  -Get your eyes checked and then per your eye specialist's recommendations  -Other issues addressed today:   -I have included below further information regarding a healthy whole foods based diet, physical activity guidelines for adults, stress management and opportunities for social connections. I hope you find this information useful.   -----------------------------------------------------------------------------------------------------------------------------------------------------------------------------------------------------------------------------------------------------------    NUTRITION: -eat real food: lots of colorful vegetables (half the plate) and fruits -5-7 servings of vegetables and fruits per day (fresh or steamed is best), exp. 2 servings of vegetables with lunch and dinner and 2 servings of fruit per day. Berries and greens such as kale and collards are great choices.  -consume on a regular basis:  fresh fruits, fresh veggies, fish, nuts, seeds, healthy oils (such as olive oil, avocado oil), whole grains (make sure for bread/pasta/crackers/etc., that the first  ingredient on label contains the word whole), legumes. -can eat small amounts of dairy and lean meat (no larger than the palm of your hand), but avoid processed meats such as ham, bacon, lunch meat, etc. -drink water -try to avoid fast food and pre-packaged foods, processed meat, ultra processed foods/beverages (donuts, candy, etc.) -most experts advise limiting sodium to < 2300mg  per day, should limit further is any chronic conditions such as high blood pressure, heart disease, diabetes, etc. The American Heart Association advised that < 1500mg  is is ideal -try to avoid foods/beverages that contain any ingredients with names you do not recognize  -try to avoid foods/beverages  with added sugar or sweeteners/sweets  -try to avoid sweet drinks (including diet drinks): soda, juice, Gatorade, sweet tea, power drinks, diet drinks -try to avoid white rice, white bread, pasta (unless whole grain)  EXERCISE GUIDELINES FOR ADULTS: -if you wish to increase your physical activity, do so gradually and with the approval of your doctor -STOP and seek medical care immediately if you have any chest pain, chest discomfort or trouble breathing when starting or increasing exercise  -move and stretch your body, legs, feet and arms when sitting for long periods -Physical activity guidelines  for optimal health in adults: -get at least 150 minutes per week of moderate exercise (can talk, but not sing); this is about 20-30 minutes of sustained activity 5-7 days per week or two 10-15 minute episodes of sustained activity 5-7 days per week -do some muscle building/resistance training/strength training at least 2 days per week  -balance exercises 3+ days per week:   Stand somewhere where you have something sturdy to hold onto if you lose balance    1) lift up on toes, then back down, start with 5x per day and work up to 20x   2) stand and lift one leg straight out to the side so that foot is a few inches of the floor,  start with 5x each side and work up to 20x each side   3) stand on one foot, start with 5 seconds each side and work up to 20 seconds on each side  If you need ideas or help with getting more active:  -Silver sneakers https://tools.silversneakers.com  -Walk with a Doc: http://www.duncan-williams.com/  -try to include resistance (weight lifting/strength building) and balance exercises twice per week: or the following link for ideas: http://castillo-powell.com/  BuyDucts.dk  STRESS MANAGEMENT: -can try meditating, or just sitting quietly with deep breathing while intentionally relaxing all parts of your body for 5 minutes daily -if you need further help with stress, anxiety or depression please follow up with your primary doctor or contact the wonderful folks at [facility]: [phone]  SOCIAL CONNECTIONS: -options in [place] if you wish to engage in more social and exercise related activities:  -Silver sneakers https://tools.silversneakers.com  -Walk with a Doc: http://www.duncan-williams.com/  -Check out the [facility] Active Adults 50+ section on the [place] [employer] (hiking clubs, book clubs, cards and games, chess, exercise classes, aquatic classes and much more) - see the website for details: https://www.[place].gov/departments/parks-recreation/active-adults50  -YouTube has lots of exercise videos for different ages and abilities as well  -[facility] (a variety of indoor and outdoor inperson activities for adults). [phone]. [address]  -Virtual Online Classes (a variety of topics): see seniorplanet.org or call [phone]  -consider volunteering at a school, hospice center, church, senior center or elsewhere            Chiquita JONELLE Cramp, DO

## 2024-06-04 NOTE — Patient Instructions (Signed)
 I really enjoyed getting to talk with you today! I am available on Tuesdays and Thursdays for virtual visits if you have any questions or concerns, or if I can be of any further assistance.   CHECKLIST FROM ANNUAL WELLNESS VISIT:  -Follow up (please call to schedule if not scheduled after visit):   -yearly for annual wellness visit with primary care office  Here is a list of your preventive care/health maintenance measures and the plan for each if any are due:  PLAN For any measures below that may be due:    1. Can get Tdap, covid and flu shots at the pharmacy if you wish. Please get a copy of receipt to bring to [facility] so that we can update your record.   2. Please let us  know if you wish to do the bone density test.   Health Maintenance  Topic Date Due   COVID-19 Vaccine (6 - 2024-25 season) 07/09/2023   DTaP/Tdap/Td (3 - Td or Tdap) 01/08/2024   Zoster Vaccines- Shingrix (1 of 2) 09/04/2024 (Originally 10/21/1998)   INFLUENZA VACCINE  06/07/2024   Medicare Annual Wellness (AWV)  06/04/2025   Colonoscopy  03/31/2027   Pneumococcal Vaccine: 50+ Years  Completed   DEXA SCAN  Completed   Hepatitis C Screening  Completed   Hepatitis B Vaccines  Aged Out   HPV VACCINES  Aged Out   Meningococcal B Vaccine  Aged Out    -See a dentist at least yearly  -Get your eyes checked and then per your eye specialist's recommendations  -Other issues addressed today:   -I have included below further information regarding a healthy whole foods based diet, physical activity guidelines for adults, stress management and opportunities for social connections. I hope you find this information useful.   -----------------------------------------------------------------------------------------------------------------------------------------------------------------------------------------------------------------------------------------------------------    NUTRITION: -eat real food: lots of colorful  vegetables (half the plate) and fruits -5-7 servings of vegetables and fruits per day (fresh or steamed is best), exp. 2 servings of vegetables with lunch and dinner and 2 servings of fruit per day. Berries and greens such as kale and collards are great choices.  -consume on a regular basis:  fresh fruits, fresh veggies, fish, nuts, seeds, healthy oils (such as olive oil, avocado oil), whole grains (make sure for bread/pasta/crackers/etc., that the first ingredient on label contains the word whole), legumes. -can eat small amounts of dairy and lean meat (no larger than the palm of your hand), but avoid processed meats such as ham, bacon, lunch meat, etc. -drink water -try to avoid fast food and pre-packaged foods, processed meat, ultra processed foods/beverages (donuts, candy, etc.) -most experts advise limiting sodium to < 2300mg  per day, should limit further is any chronic conditions such as high blood pressure, heart disease, diabetes, etc. The American Heart Association advised that < 1500mg  is is ideal -try to avoid foods/beverages that contain any ingredients with names you do not recognize  -try to avoid foods/beverages  with added sugar or sweeteners/sweets  -try to avoid sweet drinks (including diet drinks): soda, juice, Gatorade, sweet tea, power drinks, diet drinks -try to avoid white rice, white bread, pasta (unless whole grain)  EXERCISE GUIDELINES FOR ADULTS: -if you wish to increase your physical activity, do so gradually and with the approval of your doctor -STOP and seek medical care immediately if you have any chest pain, chest discomfort or trouble breathing when starting or increasing exercise  -move and stretch your body, legs, feet and arms when sitting for long  periods -Physical activity guidelines for optimal health in adults: -get at least 150 minutes per week of moderate exercise (can talk, but not sing); this is about 20-30 minutes of sustained activity 5-7 days per week  or two 10-15 minute episodes of sustained activity 5-7 days per week -do some muscle building/resistance training/strength training at least 2 days per week  -balance exercises 3+ days per week:   Stand somewhere where you have something sturdy to hold onto if you lose balance    1) lift up on toes, then back down, start with 5x per day and work up to 20x   2) stand and lift one leg straight out to the side so that foot is a few inches of the floor, start with 5x each side and work up to 20x each side   3) stand on one foot, start with 5 seconds each side and work up to 20 seconds on each side  If you need ideas or help with getting more active:  -Silver sneakers https://tools.silversneakers.com  -Walk with a Doc: http://www.duncan-williams.com/  -try to include resistance (weight lifting/strength building) and balance exercises twice per week: or the following link for ideas: http://castillo-powell.com/  BuyDucts.dk  STRESS MANAGEMENT: -can try meditating, or just sitting quietly with deep breathing while intentionally relaxing all parts of your body for 5 minutes daily -if you need further help with stress, anxiety or depression please follow up with your primary doctor or contact the wonderful folks at [facility]: [phone]  SOCIAL CONNECTIONS: -options in [place] if you wish to engage in more social and exercise related activities:  -Silver sneakers https://tools.silversneakers.com  -Walk with a Doc: http://www.duncan-williams.com/  -Check out the [facility] Active Adults 50+ section on the [place] of [employer] (hiking clubs, book clubs, cards and games, chess, exercise classes, aquatic classes and much more) - see the website for details: https://www.[place].gov/departments/parks-recreation/active-adults50  -YouTube has lots of exercise videos for different ages and  abilities as well  -[facility] (a variety of indoor and outdoor inperson activities for adults). [phone]. [address]  -Virtual Online Classes (a variety of topics): see seniorplanet.org or call ([phone]) [phone]  -consider volunteering at a school, hospice center, church, senior center or elsewhere

## 2024-07-09 ENCOUNTER — Ambulatory Visit: Admitting: Internal Medicine

## 2024-07-09 ENCOUNTER — Encounter: Payer: Self-pay | Admitting: Internal Medicine

## 2024-07-09 VITALS — BP 110/78 | HR 71 | Temp 98.0°F

## 2024-07-09 DIAGNOSIS — M436 Torticollis: Secondary | ICD-10-CM | POA: Diagnosis not present

## 2024-07-09 DIAGNOSIS — K047 Periapical abscess without sinus: Secondary | ICD-10-CM

## 2024-07-09 MED ORDER — CYCLOBENZAPRINE HCL 5 MG PO TABS: 5.0000 mg | ORAL_TABLET | Freq: Three times a day (TID) | ORAL | 1 refills | Status: AC | PRN

## 2024-07-09 MED ORDER — CLINDAMYCIN HCL 300 MG PO CAPS: 300.0000 mg | ORAL_CAPSULE | Freq: Three times a day (TID) | ORAL | 0 refills | Status: AC

## 2024-07-09 NOTE — Progress Notes (Signed)
 Established Patient Office Visit     CC/Reason for Visit: Discuss acute concerns  HPI: Taneisha Tesmer is a 78 y.o. female who is coming in today for the above mentioned reasons.  She is here today to discuss 2 main issues:  1.  She has a left dental abscess.  It has been bothering her for some time but over the last week has gotten more swollen and painful.  They are in the process of securing a dental appointment for her but are wondering if we could prescribe her.  Her current  2.  For the past couple of weeks she has been having severe right neck pain.  She woke up with the pain 1 day after going to bed normal the night before.  She assumes she slept wrong.  Any movement of her neck causes severe pain.  No falls or injuries.   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    Glaucoma    History of DVT of lower extremity 1990s   Obesity    Pneumonia    as child    Past Surgical History:  Procedure Laterality Date   ABDOMINAL HYSTERECTOMY  2006   total   breast biopsy 2008 Right    benign   BREAST EXCISIONAL BIOPSY Left    nipple excision   BREAST EXCISIONAL BIOPSY Right    CATARACT EXTRACTION     07/05/17 (left eye) and 07/26/17 (right eye)   COLONOSCOPY WITH PROPOFOL  N/A 03/30/2017   Procedure: COLONOSCOPY WITH PROPOFOL ;  Surgeon: Teressa Toribio SQUIBB, MD;  Location: [facility];  Service: Endoscopy;  Laterality: N/A;   colonscopy  2008   OOPHORECTOMY     bilateral   SUPRANUMERARY NIPPLE EXCISION     right breast    Social History:  reports that she has never smoked. She has never used smokeless tobacco. She reports that she does not drink alcohol and does not use drugs.  Allergies: Allergies  Allergen Reactions   Penicillins Shortness Of Breath, Itching and Rash    Has patient had a PCN  reaction causing immediate rash, facial/tongue/throat swelling, SOB or lightheadedness with hypotension: Yes Has patient had a PCN reaction causing severe rash involving mucus membranes or skin necrosis: Unknown Has patient had a PCN reaction that required hospitalization: No Has patient had a PCN reaction occurring within the last 10 years: No If all of the above answers are NO, then may proceed with Cephalosporin use.   Shingrix [Zoster Vac Recomb Adjuvanted] Itching    Severe per patient    Family History:  Family History  Problem Relation Age of Onset   Heart disease Mother        CHF   Arthritis Mother    Diabetes Mother    COPD Father    Cancer Father        lung   Kidney disease Sister        kidney failure     Current Outpatient Medications:    clindamycin  (CLEOCIN ) 300 MG capsule, Take 1 capsule (300 mg total) by mouth 3 (three) times daily for 7 days., Disp: 21 capsule, Rfl: 0   cyclobenzaprine  (FLEXERIL ) 5 MG tablet, Take 1 tablet (5 mg total) by mouth 3 (three) times daily as  needed for muscle spasms., Disp: 30 tablet, Rfl: 1   atorvastatin  (LIPITOR) 10 MG tablet, Take 1 tablet (10 mg total) by mouth daily. (Patient not taking: Reported on 07/09/2024), Disp: 90 tablet, Rfl: 2  Review of Systems:  Negative unless indicated in HPI.   Physical Exam: Vitals:   07/09/24 0728  BP: 110/78  Pulse: 71  Temp: 98 F (36.7 C)  TempSrc: Oral  SpO2: 93%    There is no height or weight on file to calculate BMI.    Impression and Plan:  Dental abscess -     Clindamycin  HCl; Take 1 capsule (300 mg total) by mouth 3 (three) times daily for 7 days.  Dispense: 21 capsule; Refill: 0  Torticollis -     Cyclobenzaprine  HCl; Take 1 tablet (5 mg total) by mouth 3 (three) times daily as needed for muscle spasms.  Dispense: 30 tablet; Refill: 1   - Given her severe penicillin allergy have prescribed clindamycin  for her dental abscess, they will find a dentist to see her  urgently this week. - For torticollis have advised warm compresses, massage and will also send some Flexeril  to use as needed.  Time spent:31 minutes reviewing chart, interviewing and examining patient and formulating plan of care.     Tully Theophilus Andrews, MD [facility]

## 2024-11-04 ENCOUNTER — Other Ambulatory Visit: Payer: Self-pay | Admitting: Adult Health

## 2024-11-04 DIAGNOSIS — Z1231 Encounter for screening mammogram for malignant neoplasm of breast: Secondary | ICD-10-CM

## 2024-11-05 ENCOUNTER — Telehealth: Payer: Self-pay

## 2024-11-05 NOTE — Telephone Encounter (Unsigned)
 Copied from CRM #[id number]. Topic: Clinical - Medical Advice >> Nov 01, 2024  3:04 PM Victoria A wrote: Reason for CRM: Patient would like to find out if office will schedule mammogram or how does that work? Please call her at [phone]

## 2024-11-05 NOTE — Telephone Encounter (Signed)
 Spoke to pt and she stated that someone reached out to her today to schedule the mammogram. No further action needed.

## 2024-11-26 ENCOUNTER — Ambulatory Visit: Payer: Self-pay

## 2024-11-26 NOTE — Telephone Encounter (Signed)
 FYI Only or Action Required?: FYI only for provider: appointment scheduled on 1/21.  Patient was last seen in primary care on 07/09/2024 by Theophilus Andrews, Tully GRADE, MD.  Called Nurse Triage reporting Diarrhea.  Symptoms began several days ago.  Interventions attempted: Rest, hydration, or home remedies.  Symptoms are: gradually worsening.  Triage Disposition: See Physician Within 24 Hours  Patient/caregiver understands and will follow disposition?: Yes  Message from [facility] D sent at 11/26/2024 10:06 AM EST  Diarrhea for about 3 or 4 days says he pcp said to call and make a appt   Reason for Disposition  [1] MODERATE diarrhea (e.g., 4-6 times / day more than normal) AND [2] age > 70 years  Answer Assessment - Initial Assessment Questions Diarrhea for the last 3-4days. 2-3 episodes a day- triggered by eating certain foods. Hard to determine which ones. Abdominal cramping (4/10 pain) intermittent and resolves post BM.   Denies Fever, CP, SOB, dizziness. Staying hydrated. Has fatigue.   Only wanting to see PCP. Appt made for 1/21 to assess. Patient will CB with any additional concerns and understands ED precautions    1. DIARRHEA SEVERITY: How bad is the diarrhea? How many more stools have you had in the past 24 hours than normal?      moderate 2. ONSET: When did the diarrhea begin?      3-4days ago  3. STOOL DESCRIPTION:  How loose or watery is the diarrhea? What is the stool color? Is there any blood or mucous in the stool?     Loose-watery stool -- darker but not blood  4. VOMITING: Are you also vomiting? If Yes, ask: How many times in the past 24 hours?      denies 5. ABDOMEN PAIN: Are you having any abdomen pain? If Yes, ask: What does it feel like? (e.g., crampy, dull, intermittent, constant)      Cramping  6. ABDOMEN PAIN SEVERITY: If present, ask: How bad is the pain?  (e.g., Scale 1-10; mild, moderate, or severe)     4-5/10 7. ORAL INTAKE: If  vomiting, Have you been able to drink liquids? How much liquids have you had in the past 24 hours?     Drinking water  8. HYDRATION: Any signs of dehydration? (e.g., dry mouth [not just dry lips], too weak to stand, dizziness, new weight loss) When did you last urinate?     Fatigue  9. EXPOSURE: Have you traveled to a foreign country recently? Have you been exposed to anyone with diarrhea? Could you have eaten any food that was spoiled?     Unsure if exposure 10. ANTIBIOTIC USE: Are you taking antibiotics now or have you taken antibiotics in the past 2 months?       denies 11. OTHER SYMPTOMS: Do you have any other symptoms? (e.g., fever, blood in stool)       Dark stool but unsure if blood  Protocols used: Diarrhea-A-AH

## 2024-11-27 ENCOUNTER — Ambulatory Visit: Admitting: Adult Health

## 2024-11-27 ENCOUNTER — Encounter: Payer: Self-pay | Admitting: Adult Health

## 2024-11-27 VITALS — BP 110/72 | HR 73 | Temp 98.6°F | Ht <= 58 in | Wt 167.0 lb

## 2024-11-27 DIAGNOSIS — R197 Diarrhea, unspecified: Secondary | ICD-10-CM

## 2024-11-27 DIAGNOSIS — R634 Abnormal weight loss: Secondary | ICD-10-CM

## 2024-11-27 LAB — CBC
HCT: 28.1 % — ABNORMAL LOW (ref 36.0–46.0)
Hemoglobin: 9.4 g/dL — ABNORMAL LOW (ref 12.0–15.0)
MCHC: 33.6 g/dL (ref 30.0–36.0)
MCV: 86.3 fl (ref 78.0–100.0)
Platelets: 161 K/uL (ref 150.0–400.0)
RBC: 3.26 Mil/uL — ABNORMAL LOW (ref 3.87–5.11)
RDW: 15 % (ref 11.5–15.5)
WBC: 5.3 K/uL (ref 4.0–10.5)

## 2024-11-27 LAB — COMPREHENSIVE METABOLIC PANEL WITH GFR
ALT: 6 U/L (ref 3–35)
AST: 16 U/L (ref 5–37)
Albumin: 4 g/dL (ref 3.5–5.2)
Alkaline Phosphatase: 47 U/L (ref 39–117)
BUN: 13 mg/dL (ref 6–23)
CO2: 25 meq/L (ref 19–32)
Calcium: 10 mg/dL (ref 8.4–10.5)
Chloride: 106 meq/L (ref 96–112)
Creatinine, Ser: 0.85 mg/dL (ref 0.40–1.20)
GFR: 66.7 mL/min
Glucose, Bld: 75 mg/dL (ref 70–99)
Potassium: 3.8 meq/L (ref 3.5–5.1)
Sodium: 136 meq/L (ref 135–145)
Total Bilirubin: 0.6 mg/dL (ref 0.2–1.2)
Total Protein: 8 g/dL (ref 6.0–8.3)

## 2024-11-27 LAB — C-REACTIVE PROTEIN: CRP: 1.3 mg/dL (ref 1.0–20.0)

## 2024-11-27 LAB — SEDIMENTATION RATE: Sed Rate: 63 mm/h — ABNORMAL HIGH (ref 0–30)

## 2024-11-27 NOTE — Progress Notes (Signed)
" ° °  Acute Office Visit  Subjective:     Patient ID: Carla Nash, female    DOB: [birth year], 79 y.o.   MRN: [id number]  Chief Complaint  Patient presents with   Diarrhea   Patient is in today for acute on chronic diarrhea. She reports that she has had diarrhea for many years. She states it has been worse over the last month and reports that she has loose stools every time she eats. Also verbalizes a decreased appetite. She denies recent travel, sick contacts, recent antibiotic use, nausea, vomiting, melena, hematochezia, or abdominal pain. She has been taking over-the-counter antidiarrheals with no improvement in her symptoms.   Patient is requesting a new referral to gastroenterology. She was last evaluated by GI in 2018 and completed a colonoscopy with findings consistent with diverticulosis and internal hemorrhoids. She has not followed up with GI since that time.    Review of Systems  Constitutional:  Positive for weight loss.  Gastrointestinal:  Positive for diarrhea.  See HPI    Objective:    BP 110/72   Pulse 73   Temp 98.6 F (37 C) (Oral)   Ht 4' 10 (1.473 m)   Wt 75.8 kg   SpO2 97%   BMI 34.90 kg/m   Wt Readings from Last 3 Encounters:  11/27/24 75.8 kg  01/09/24 85.6 kg  09/28/23 89.8 kg    Physical Exam Vitals reviewed.  Constitutional:      General: She is not in acute distress.    Appearance: Normal appearance. She is not ill-appearing.  HENT:     Head: Normocephalic and atraumatic.     Nose: Nose normal.     Mouth/Throat:     Mouth: Mucous membranes are moist.     Pharynx: Oropharynx is clear.  Eyes:     Extraocular Movements: Extraocular movements intact.     Conjunctiva/sclera: Conjunctivae normal.  Cardiovascular:     Rate and Rhythm: Regular rhythm. Bradycardia present.     Pulses: Normal pulses.     Heart sounds: Normal heart sounds.  Pulmonary:     Effort: Pulmonary effort is normal.     Breath sounds: Normal breath sounds.  Abdominal:      General: Bowel sounds are normal. There is no distension.     Palpations: Abdomen is soft. There is no mass.     Tenderness: There is no abdominal tenderness. There is no guarding or rebound.  Musculoskeletal:        General: Normal range of motion.     Cervical back: Normal range of motion and neck supple.  Lymphadenopathy:     Cervical: No cervical adenopathy.  Skin:    General: Skin is warm and dry.     Capillary Refill: Capillary refill takes less than 2 seconds.  Neurological:     Mental Status: She is alert and oriented to person, place, and time.  Psychiatric:        Mood and Affect: Mood normal.        Behavior: Behavior normal.       Assessment & Plan:   1. Diarrhea, unspecified type (Primary) - CBC; Future - CMP; Future - Gastrointestinal Panel by PCR , Stool; Future - Sedimentation Rate; Future - C-reactive Protein; Future - Ambulatory referral to Gastroenterology - Will consider daily Bentyl  pending lab results  2. Unintentional weight loss - Will order CT abd/pelvis with contrast pending lab results  Debby CHRISTELLA Borer, RN FNP Student   "

## 2024-11-28 ENCOUNTER — Other Ambulatory Visit: Payer: Self-pay | Admitting: Adult Health

## 2024-11-28 ENCOUNTER — Telehealth: Payer: Self-pay | Admitting: Adult Health

## 2024-11-28 ENCOUNTER — Ambulatory Visit: Payer: Self-pay | Admitting: Adult Health

## 2024-11-28 ENCOUNTER — Telehealth: Payer: Self-pay

## 2024-11-28 DIAGNOSIS — D649 Anemia, unspecified: Secondary | ICD-10-CM

## 2024-11-28 DIAGNOSIS — K529 Noninfective gastroenteritis and colitis, unspecified: Secondary | ICD-10-CM

## 2024-11-28 NOTE — Telephone Encounter (Signed)
 Copied from CRM #[id number]. Topic: Clinical - Lab/Test Results >> Nov 28, 2024 11:22 AM Carla Nash wrote: Reason for CRM: patient called. Needs to know the reasoning about the lab orders.. >> Nov 28, 2024  1:22 PM Carla Nash wrote: Pt is calling back again due to lab orders she is confused as to why provider added additional labs. I was advised by CAL that Marjorie will be giving her a call back.

## 2024-11-28 NOTE — Telephone Encounter (Signed)
 Pt was seen yesterday concerning diarrhea and per agent pt sounds confuse please call pt about her visit yesterday and the next steps

## 2024-11-29 ENCOUNTER — Telehealth: Payer: Self-pay | Admitting: *Deleted

## 2024-11-29 NOTE — Telephone Encounter (Signed)
 Please see other note.

## 2024-11-29 NOTE — Telephone Encounter (Signed)
 Copied from CRM #[id number]. Topic: Clinical - Lab/Test Results >> Nov 28, 2024 11:22 AM Emylou G wrote: Reason for CRM: patient called. Needs to know the reasoning about the lab orders.. >> Nov 29, 2024  9:29 AM Rea ORN wrote: Pt called back concerned about how she is to do the tests that were ordered at her 11/27/24 appt. Pt stated she did her labs but she is also supposed to do a stool sample and CT scan. Pt is confused of next steps. Please call back to advise, [phone]  >> Nov 28, 2024  1:22 PM Drema MATSU wrote: Pt is calling back again due to lab orders she is confused as to why provider added additional labs. I was advised by CAL that Marjorie will be giving her a call back.

## 2024-11-29 NOTE — Telephone Encounter (Signed)
 I spoke to pt several times about this and she is still not understanding. I tried to call pt emergency contact but no answer. Will try again later.

## 2024-11-29 NOTE — Telephone Encounter (Signed)
 Pt emergency contact was contacted.

## 2024-11-29 NOTE — Telephone Encounter (Signed)
 This has been taking care of under result notes.

## 2024-12-05 ENCOUNTER — Other Ambulatory Visit (INDEPENDENT_AMBULATORY_CARE_PROVIDER_SITE_OTHER)

## 2024-12-05 DIAGNOSIS — D649 Anemia, unspecified: Secondary | ICD-10-CM | POA: Diagnosis not present

## 2024-12-05 LAB — IBC + FERRITIN
Ferritin: 181.2 ng/mL (ref 10.0–291.0)
Iron: 42 ug/dL (ref 42–145)
Saturation Ratios: 18.5 % — ABNORMAL LOW (ref 20.0–50.0)
TIBC: 226.8 ug/dL — ABNORMAL LOW (ref 250.0–450.0)
Transferrin: 162 mg/dL — ABNORMAL LOW (ref 212.0–360.0)

## 2024-12-05 LAB — VITAMIN B12: Vitamin B-12: 274 pg/mL (ref 211–911)

## 2024-12-06 ENCOUNTER — Ambulatory Visit: Payer: Self-pay | Admitting: Adult Health

## 2024-12-06 ENCOUNTER — Other Ambulatory Visit: Payer: Self-pay | Admitting: Adult Health

## 2024-12-06 ENCOUNTER — Other Ambulatory Visit

## 2024-12-06 LAB — ANA: Anti Nuclear Antibody (ANA): NEGATIVE

## 2024-12-20 ENCOUNTER — Ambulatory Visit

## 2025-01-09 ENCOUNTER — Ambulatory Visit: Admitting: Adult Health
